# Patient Record
Sex: Male | Born: 1950 | Race: White | Hispanic: No | Marital: Married | Smoking: Never smoker
Health system: Southern US, Community
[De-identification: ages and names within clinical notes are randomized; demographics above are authoritative.]

## PROBLEM LIST (undated history)

## (undated) DIAGNOSIS — M51369 Other intervertebral disc degeneration, lumbar region without mention of lumbar back pain or lower extremity pain: Secondary | ICD-10-CM

## (undated) DIAGNOSIS — I6529 Occlusion and stenosis of unspecified carotid artery: Secondary | ICD-10-CM

## (undated) DIAGNOSIS — E039 Hypothyroidism, unspecified: Secondary | ICD-10-CM

## (undated) DIAGNOSIS — I2510 Atherosclerotic heart disease of native coronary artery without angina pectoris: Secondary | ICD-10-CM

## (undated) DIAGNOSIS — E7800 Pure hypercholesterolemia, unspecified: Secondary | ICD-10-CM

## (undated) DIAGNOSIS — M5136 Other intervertebral disc degeneration, lumbar region: Secondary | ICD-10-CM

## (undated) DIAGNOSIS — I739 Peripheral vascular disease, unspecified: Secondary | ICD-10-CM

## (undated) HISTORY — PX: UVULECTOMY: SHX2631

## (undated) HISTORY — PX: NASAL SEPTUM SURGERY: SHX37

## (undated) HISTORY — DX: Other intervertebral disc degeneration, lumbar region: M51.36

## (undated) HISTORY — DX: Other intervertebral disc degeneration, lumbar region without mention of lumbar back pain or lower extremity pain: M51.369

## (undated) HISTORY — PX: HEMORROIDECTOMY: SUR656

## (undated) HISTORY — DX: Pure hypercholesterolemia, unspecified: E78.00

## (undated) HISTORY — PX: PROSTATECTOMY: SHX69

## (undated) HISTORY — DX: Peripheral vascular disease, unspecified: I73.9

## (undated) HISTORY — DX: Hypothyroidism, unspecified: E03.9

## (undated) HISTORY — DX: Occlusion and stenosis of unspecified carotid artery: I65.29

## (undated) HISTORY — DX: Atherosclerotic heart disease of native coronary artery without angina pectoris: I25.10

---
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
## 1986-03-22 HISTORY — PX: HERNIA REPAIR: SHX51

## 1999-03-23 HISTORY — PX: CORONARY STENT PLACEMENT: SHX1402

## 2014-07-18 LAB — PULMONARY FUNCTION TEST
FEV1/FVC: 79.5 %
FEV1: 3.1 L
FVC: 4.1 L

## 2014-09-04 DIAGNOSIS — R972 Elevated prostate specific antigen [PSA]: Secondary | ICD-10-CM

## 2014-09-04 HISTORY — DX: Elevated prostate specific antigen (PSA): R97.20

## 2014-10-03 DIAGNOSIS — N138 Other obstructive and reflux uropathy: Secondary | ICD-10-CM | POA: Insufficient documentation

## 2014-10-03 DIAGNOSIS — N401 Enlarged prostate with lower urinary tract symptoms: Secondary | ICD-10-CM

## 2014-10-03 HISTORY — DX: Benign prostatic hyperplasia with lower urinary tract symptoms: N40.1

## 2014-10-03 HISTORY — DX: Benign prostatic hyperplasia with lower urinary tract symptoms: N13.8

## 2014-10-24 DIAGNOSIS — C61 Malignant neoplasm of prostate: Secondary | ICD-10-CM | POA: Insufficient documentation

## 2014-10-24 HISTORY — DX: Malignant neoplasm of prostate: C61

## 2015-03-14 DIAGNOSIS — N529 Male erectile dysfunction, unspecified: Secondary | ICD-10-CM | POA: Insufficient documentation

## 2015-03-14 HISTORY — DX: Male erectile dysfunction, unspecified: N52.9

## 2016-05-19 DIAGNOSIS — Z860100 Personal history of colon polyps, unspecified: Secondary | ICD-10-CM

## 2016-05-19 DIAGNOSIS — K5190 Ulcerative colitis, unspecified, without complications: Secondary | ICD-10-CM

## 2016-05-19 DIAGNOSIS — R194 Change in bowel habit: Secondary | ICD-10-CM | POA: Insufficient documentation

## 2016-05-19 HISTORY — DX: Personal history of colon polyps, unspecified: Z86.0100

## 2016-05-19 HISTORY — DX: Personal history of colonic polyps: Z86.010

## 2016-05-19 HISTORY — DX: Ulcerative colitis, unspecified, without complications: K51.90

## 2016-05-19 HISTORY — DX: Change in bowel habit: R19.4

## 2017-01-24 DIAGNOSIS — J343 Hypertrophy of nasal turbinates: Secondary | ICD-10-CM

## 2017-01-24 DIAGNOSIS — H608X1 Other otitis externa, right ear: Secondary | ICD-10-CM

## 2017-01-24 HISTORY — DX: Other otitis externa, right ear: H60.8X1

## 2017-01-24 HISTORY — DX: Hypertrophy of nasal turbinates: J34.3

## 2018-12-15 DIAGNOSIS — R351 Nocturia: Secondary | ICD-10-CM

## 2018-12-15 HISTORY — DX: Nocturia: R35.1

## 2019-04-21 DIAGNOSIS — U071 COVID-19: Secondary | ICD-10-CM | POA: Diagnosis not present

## 2019-04-21 DIAGNOSIS — R05 Cough: Secondary | ICD-10-CM | POA: Diagnosis not present

## 2019-07-19 DIAGNOSIS — R399 Unspecified symptoms and signs involving the genitourinary system: Secondary | ICD-10-CM | POA: Diagnosis not present

## 2019-07-19 DIAGNOSIS — C61 Malignant neoplasm of prostate: Secondary | ICD-10-CM | POA: Diagnosis not present

## 2019-07-19 DIAGNOSIS — R3989 Other symptoms and signs involving the genitourinary system: Secondary | ICD-10-CM | POA: Diagnosis not present

## 2019-07-31 DIAGNOSIS — R739 Hyperglycemia, unspecified: Secondary | ICD-10-CM | POA: Diagnosis not present

## 2019-07-31 DIAGNOSIS — M545 Low back pain: Secondary | ICD-10-CM | POA: Diagnosis not present

## 2019-07-31 DIAGNOSIS — Z20822 Contact with and (suspected) exposure to covid-19: Secondary | ICD-10-CM | POA: Diagnosis not present

## 2019-07-31 DIAGNOSIS — M62830 Muscle spasm of back: Secondary | ICD-10-CM | POA: Diagnosis not present

## 2019-07-31 DIAGNOSIS — M129 Arthropathy, unspecified: Secondary | ICD-10-CM | POA: Diagnosis not present

## 2019-07-31 DIAGNOSIS — Z0000 Encounter for general adult medical examination without abnormal findings: Secondary | ICD-10-CM | POA: Diagnosis not present

## 2019-08-03 DIAGNOSIS — M9904 Segmental and somatic dysfunction of sacral region: Secondary | ICD-10-CM | POA: Diagnosis not present

## 2019-08-03 DIAGNOSIS — M9903 Segmental and somatic dysfunction of lumbar region: Secondary | ICD-10-CM | POA: Diagnosis not present

## 2019-08-03 DIAGNOSIS — M9902 Segmental and somatic dysfunction of thoracic region: Secondary | ICD-10-CM | POA: Diagnosis not present

## 2019-08-03 DIAGNOSIS — M9905 Segmental and somatic dysfunction of pelvic region: Secondary | ICD-10-CM | POA: Diagnosis not present

## 2019-08-06 DIAGNOSIS — M25559 Pain in unspecified hip: Secondary | ICD-10-CM | POA: Diagnosis not present

## 2019-08-06 DIAGNOSIS — M545 Low back pain, unspecified: Secondary | ICD-10-CM

## 2019-08-06 DIAGNOSIS — M25551 Pain in right hip: Secondary | ICD-10-CM | POA: Diagnosis not present

## 2019-08-06 HISTORY — DX: Low back pain, unspecified: M54.50

## 2019-08-13 DIAGNOSIS — M5136 Other intervertebral disc degeneration, lumbar region: Secondary | ICD-10-CM | POA: Diagnosis not present

## 2019-08-13 DIAGNOSIS — M545 Low back pain: Secondary | ICD-10-CM | POA: Diagnosis not present

## 2019-08-13 DIAGNOSIS — M4316 Spondylolisthesis, lumbar region: Secondary | ICD-10-CM | POA: Diagnosis not present

## 2019-08-13 DIAGNOSIS — M5126 Other intervertebral disc displacement, lumbar region: Secondary | ICD-10-CM | POA: Diagnosis not present

## 2019-08-13 DIAGNOSIS — M47816 Spondylosis without myelopathy or radiculopathy, lumbar region: Secondary | ICD-10-CM | POA: Diagnosis not present

## 2019-08-15 DIAGNOSIS — M545 Low back pain: Secondary | ICD-10-CM | POA: Diagnosis not present

## 2020-01-15 DIAGNOSIS — I2510 Atherosclerotic heart disease of native coronary artery without angina pectoris: Secondary | ICD-10-CM | POA: Diagnosis not present

## 2020-01-15 DIAGNOSIS — E039 Hypothyroidism, unspecified: Secondary | ICD-10-CM | POA: Diagnosis not present

## 2020-01-15 DIAGNOSIS — R739 Hyperglycemia, unspecified: Secondary | ICD-10-CM | POA: Diagnosis not present

## 2020-01-15 DIAGNOSIS — Z79899 Other long term (current) drug therapy: Secondary | ICD-10-CM | POA: Diagnosis not present

## 2020-01-15 DIAGNOSIS — E782 Mixed hyperlipidemia: Secondary | ICD-10-CM | POA: Diagnosis not present

## 2020-01-15 DIAGNOSIS — Z23 Encounter for immunization: Secondary | ICD-10-CM | POA: Diagnosis not present

## 2020-01-31 DIAGNOSIS — E039 Hypothyroidism, unspecified: Secondary | ICD-10-CM | POA: Diagnosis not present

## 2020-01-31 DIAGNOSIS — I2510 Atherosclerotic heart disease of native coronary artery without angina pectoris: Secondary | ICD-10-CM | POA: Diagnosis not present

## 2020-01-31 DIAGNOSIS — E782 Mixed hyperlipidemia: Secondary | ICD-10-CM | POA: Diagnosis not present

## 2020-01-31 DIAGNOSIS — I6529 Occlusion and stenosis of unspecified carotid artery: Secondary | ICD-10-CM | POA: Diagnosis not present

## 2020-01-31 DIAGNOSIS — M79606 Pain in leg, unspecified: Secondary | ICD-10-CM | POA: Diagnosis not present

## 2020-03-02 DIAGNOSIS — Z23 Encounter for immunization: Secondary | ICD-10-CM | POA: Diagnosis not present

## 2020-03-26 DIAGNOSIS — M4316 Spondylolisthesis, lumbar region: Secondary | ICD-10-CM | POA: Diagnosis not present

## 2020-03-26 DIAGNOSIS — M5126 Other intervertebral disc displacement, lumbar region: Secondary | ICD-10-CM | POA: Diagnosis not present

## 2020-03-28 DIAGNOSIS — R6889 Other general symptoms and signs: Secondary | ICD-10-CM | POA: Insufficient documentation

## 2020-03-28 HISTORY — DX: Other general symptoms and signs: R68.89

## 2020-04-10 DIAGNOSIS — M545 Low back pain, unspecified: Secondary | ICD-10-CM | POA: Diagnosis not present

## 2020-04-10 DIAGNOSIS — M5126 Other intervertebral disc displacement, lumbar region: Secondary | ICD-10-CM | POA: Diagnosis not present

## 2020-04-10 DIAGNOSIS — R6889 Other general symptoms and signs: Secondary | ICD-10-CM | POA: Diagnosis not present

## 2020-04-22 DIAGNOSIS — R6889 Other general symptoms and signs: Secondary | ICD-10-CM | POA: Diagnosis not present

## 2020-04-22 DIAGNOSIS — M545 Low back pain, unspecified: Secondary | ICD-10-CM | POA: Diagnosis not present

## 2020-04-30 DIAGNOSIS — E039 Hypothyroidism, unspecified: Secondary | ICD-10-CM | POA: Diagnosis not present

## 2020-04-30 DIAGNOSIS — Z79899 Other long term (current) drug therapy: Secondary | ICD-10-CM | POA: Diagnosis not present

## 2020-04-30 DIAGNOSIS — E782 Mixed hyperlipidemia: Secondary | ICD-10-CM | POA: Diagnosis not present

## 2020-04-30 DIAGNOSIS — I2510 Atherosclerotic heart disease of native coronary artery without angina pectoris: Secondary | ICD-10-CM | POA: Diagnosis not present

## 2020-04-30 DIAGNOSIS — Z0000 Encounter for general adult medical examination without abnormal findings: Secondary | ICD-10-CM | POA: Diagnosis not present

## 2020-04-30 DIAGNOSIS — R739 Hyperglycemia, unspecified: Secondary | ICD-10-CM | POA: Diagnosis not present

## 2020-05-05 DIAGNOSIS — R6889 Other general symptoms and signs: Secondary | ICD-10-CM | POA: Diagnosis not present

## 2020-05-05 DIAGNOSIS — M545 Low back pain, unspecified: Secondary | ICD-10-CM | POA: Diagnosis not present

## 2020-05-14 DIAGNOSIS — R6889 Other general symptoms and signs: Secondary | ICD-10-CM | POA: Diagnosis not present

## 2020-05-14 DIAGNOSIS — M545 Low back pain, unspecified: Secondary | ICD-10-CM | POA: Diagnosis not present

## 2020-05-27 DIAGNOSIS — I272 Pulmonary hypertension, unspecified: Secondary | ICD-10-CM | POA: Diagnosis not present

## 2020-05-27 DIAGNOSIS — E039 Hypothyroidism, unspecified: Secondary | ICD-10-CM | POA: Diagnosis not present

## 2020-05-27 DIAGNOSIS — I739 Peripheral vascular disease, unspecified: Secondary | ICD-10-CM | POA: Diagnosis not present

## 2020-05-27 DIAGNOSIS — I2510 Atherosclerotic heart disease of native coronary artery without angina pectoris: Secondary | ICD-10-CM | POA: Diagnosis not present

## 2020-05-27 DIAGNOSIS — E782 Mixed hyperlipidemia: Secondary | ICD-10-CM | POA: Diagnosis not present

## 2020-06-05 DIAGNOSIS — M4316 Spondylolisthesis, lumbar region: Secondary | ICD-10-CM | POA: Diagnosis not present

## 2020-06-05 DIAGNOSIS — M5126 Other intervertebral disc displacement, lumbar region: Secondary | ICD-10-CM | POA: Diagnosis not present

## 2020-06-11 DIAGNOSIS — M4316 Spondylolisthesis, lumbar region: Secondary | ICD-10-CM | POA: Diagnosis not present

## 2020-06-11 DIAGNOSIS — M5126 Other intervertebral disc displacement, lumbar region: Secondary | ICD-10-CM | POA: Diagnosis not present

## 2020-06-26 DIAGNOSIS — M4310 Spondylolisthesis, site unspecified: Secondary | ICD-10-CM | POA: Diagnosis not present

## 2020-06-26 DIAGNOSIS — G959 Disease of spinal cord, unspecified: Secondary | ICD-10-CM

## 2020-06-26 DIAGNOSIS — M5416 Radiculopathy, lumbar region: Secondary | ICD-10-CM

## 2020-06-26 HISTORY — DX: Disease of spinal cord, unspecified: G95.9

## 2020-06-26 HISTORY — DX: Spondylolisthesis, site unspecified: M43.10

## 2020-06-26 HISTORY — DX: Radiculopathy, lumbar region: M54.16

## 2020-06-30 ENCOUNTER — Other Ambulatory Visit: Payer: Self-pay | Admitting: Neurosurgery

## 2020-06-30 DIAGNOSIS — G959 Disease of spinal cord, unspecified: Secondary | ICD-10-CM

## 2020-07-01 DIAGNOSIS — G8929 Other chronic pain: Secondary | ICD-10-CM

## 2020-07-01 DIAGNOSIS — M7552 Bursitis of left shoulder: Secondary | ICD-10-CM | POA: Diagnosis not present

## 2020-07-01 DIAGNOSIS — M7551 Bursitis of right shoulder: Secondary | ICD-10-CM | POA: Diagnosis not present

## 2020-07-01 DIAGNOSIS — M5126 Other intervertebral disc displacement, lumbar region: Secondary | ICD-10-CM | POA: Diagnosis not present

## 2020-07-01 DIAGNOSIS — M542 Cervicalgia: Secondary | ICD-10-CM | POA: Diagnosis not present

## 2020-07-01 DIAGNOSIS — M5416 Radiculopathy, lumbar region: Secondary | ICD-10-CM | POA: Diagnosis not present

## 2020-07-01 HISTORY — DX: Other chronic pain: G89.29

## 2020-07-01 HISTORY — DX: Bursitis of right shoulder: M75.51

## 2020-07-01 HISTORY — DX: Bursitis of left shoulder: M75.52

## 2020-07-05 ENCOUNTER — Ambulatory Visit
Admission: RE | Admit: 2020-07-05 | Discharge: 2020-07-05 | Disposition: A | Discharge status: Home / Self Care | Payer: BLUE CROSS/BLUE SHIELD | Source: Ambulatory Visit | Attending: Neurosurgery | Admitting: Neurosurgery

## 2020-07-05 ENCOUNTER — Other Ambulatory Visit: Payer: Self-pay

## 2020-07-05 DIAGNOSIS — G959 Disease of spinal cord, unspecified: Secondary | ICD-10-CM

## 2020-07-09 DIAGNOSIS — M5416 Radiculopathy, lumbar region: Secondary | ICD-10-CM | POA: Diagnosis not present

## 2020-07-23 DIAGNOSIS — M7551 Bursitis of right shoulder: Secondary | ICD-10-CM | POA: Diagnosis not present

## 2020-07-23 DIAGNOSIS — Z6825 Body mass index (BMI) 25.0-25.9, adult: Secondary | ICD-10-CM | POA: Diagnosis not present

## 2020-07-25 DIAGNOSIS — M25511 Pain in right shoulder: Secondary | ICD-10-CM | POA: Diagnosis not present

## 2020-07-25 DIAGNOSIS — M25512 Pain in left shoulder: Secondary | ICD-10-CM | POA: Diagnosis not present

## 2020-07-31 DIAGNOSIS — M25561 Pain in right knee: Secondary | ICD-10-CM | POA: Diagnosis not present

## 2020-07-31 DIAGNOSIS — M25511 Pain in right shoulder: Secondary | ICD-10-CM | POA: Diagnosis not present

## 2020-07-31 DIAGNOSIS — M25512 Pain in left shoulder: Secondary | ICD-10-CM | POA: Diagnosis not present

## 2020-08-04 DIAGNOSIS — Z125 Encounter for screening for malignant neoplasm of prostate: Secondary | ICD-10-CM | POA: Diagnosis not present

## 2020-08-04 DIAGNOSIS — R7309 Other abnormal glucose: Secondary | ICD-10-CM | POA: Diagnosis not present

## 2020-08-04 DIAGNOSIS — Z6825 Body mass index (BMI) 25.0-25.9, adult: Secondary | ICD-10-CM | POA: Diagnosis not present

## 2020-08-04 DIAGNOSIS — E038 Other specified hypothyroidism: Secondary | ICD-10-CM | POA: Diagnosis not present

## 2020-08-04 DIAGNOSIS — E7800 Pure hypercholesterolemia, unspecified: Secondary | ICD-10-CM | POA: Diagnosis not present

## 2020-08-04 DIAGNOSIS — Z8546 Personal history of malignant neoplasm of prostate: Secondary | ICD-10-CM | POA: Diagnosis not present

## 2020-08-05 DIAGNOSIS — M25512 Pain in left shoulder: Secondary | ICD-10-CM | POA: Diagnosis not present

## 2020-08-05 DIAGNOSIS — M25511 Pain in right shoulder: Secondary | ICD-10-CM | POA: Diagnosis not present

## 2020-09-04 DIAGNOSIS — M5416 Radiculopathy, lumbar region: Secondary | ICD-10-CM | POA: Diagnosis not present

## 2020-09-09 DIAGNOSIS — M25511 Pain in right shoulder: Secondary | ICD-10-CM | POA: Diagnosis not present

## 2020-11-06 DIAGNOSIS — E7800 Pure hypercholesterolemia, unspecified: Secondary | ICD-10-CM | POA: Diagnosis not present

## 2020-11-06 DIAGNOSIS — I2510 Atherosclerotic heart disease of native coronary artery without angina pectoris: Secondary | ICD-10-CM | POA: Diagnosis not present

## 2020-11-06 DIAGNOSIS — E039 Hypothyroidism, unspecified: Secondary | ICD-10-CM | POA: Diagnosis not present

## 2020-11-06 DIAGNOSIS — Z6825 Body mass index (BMI) 25.0-25.9, adult: Secondary | ICD-10-CM | POA: Diagnosis not present

## 2020-12-18 DIAGNOSIS — R198 Other specified symptoms and signs involving the digestive system and abdomen: Secondary | ICD-10-CM | POA: Diagnosis not present

## 2020-12-18 DIAGNOSIS — Z1211 Encounter for screening for malignant neoplasm of colon: Secondary | ICD-10-CM | POA: Diagnosis not present

## 2021-01-11 DIAGNOSIS — Z23 Encounter for immunization: Secondary | ICD-10-CM | POA: Diagnosis not present

## 2021-02-03 DIAGNOSIS — M79606 Pain in leg, unspecified: Secondary | ICD-10-CM | POA: Diagnosis not present

## 2021-02-03 DIAGNOSIS — I6529 Occlusion and stenosis of unspecified carotid artery: Secondary | ICD-10-CM | POA: Diagnosis not present

## 2021-02-09 DIAGNOSIS — I2510 Atherosclerotic heart disease of native coronary artery without angina pectoris: Secondary | ICD-10-CM | POA: Diagnosis not present

## 2021-02-09 DIAGNOSIS — E7800 Pure hypercholesterolemia, unspecified: Secondary | ICD-10-CM | POA: Diagnosis not present

## 2021-02-09 DIAGNOSIS — R5383 Other fatigue: Secondary | ICD-10-CM | POA: Diagnosis not present

## 2021-02-09 DIAGNOSIS — E039 Hypothyroidism, unspecified: Secondary | ICD-10-CM | POA: Diagnosis not present

## 2021-02-09 DIAGNOSIS — I499 Cardiac arrhythmia, unspecified: Secondary | ICD-10-CM | POA: Diagnosis not present

## 2021-02-19 ENCOUNTER — Encounter: Payer: Self-pay | Admitting: *Deleted

## 2021-02-19 ENCOUNTER — Encounter: Payer: Self-pay | Admitting: Cardiology

## 2021-02-19 ENCOUNTER — Other Ambulatory Visit: Payer: Self-pay

## 2021-02-19 DIAGNOSIS — E039 Hypothyroidism, unspecified: Secondary | ICD-10-CM | POA: Insufficient documentation

## 2021-02-19 DIAGNOSIS — M5136 Other intervertebral disc degeneration, lumbar region: Secondary | ICD-10-CM | POA: Insufficient documentation

## 2021-02-19 DIAGNOSIS — I6529 Occlusion and stenosis of unspecified carotid artery: Secondary | ICD-10-CM | POA: Insufficient documentation

## 2021-02-19 DIAGNOSIS — E7800 Pure hypercholesterolemia, unspecified: Secondary | ICD-10-CM | POA: Insufficient documentation

## 2021-02-19 DIAGNOSIS — I2510 Atherosclerotic heart disease of native coronary artery without angina pectoris: Secondary | ICD-10-CM | POA: Insufficient documentation

## 2021-02-19 DIAGNOSIS — I739 Peripheral vascular disease, unspecified: Secondary | ICD-10-CM | POA: Insufficient documentation

## 2021-02-19 NOTE — Progress Notes (Signed)
Cardiology Office Note:    Date:  02/20/2021   ID:  Dylan Mcdowell, DOB Aug 22, 1950, MRN [id number]  PCP:  Esperanza Richters, NP  Cardiologist:  Shirlee More, MD   Referring MD: Esperanza Richters, NP  ASSESSMENT:    1. Coronary artery disease involving native coronary artery of native heart without angina pectoris   2. Stenosis of carotid artery, unspecified laterality   3. Hypercholesterolemia    PLAN:    In order of problems listed above:  He has a long history of CAD multiple PCI's 2001 for in-stent restenosis and since then has done well without recurrent angina.  At this time I do not see any indication for ischemia evaluation continue his aspirin statin and trend blood pressures at home. I requested records from his carotids and lower extremity vascular disease Continue statin access labs with LDL  Next appointment 1 year   Medication Adjustments/Labs and Tests Ordered: Current medicines are reviewed at length with the patient today.  Concerns regarding medicines are outlined above.  Orders Placed This Encounter  Procedures   EKG 12-Lead   Meds ordered this encounter  Medications   DISCONTD: aspirin EC 81 MG tablet    Sig: Take 1 tablet (81 mg total) by mouth daily. Swallow whole.    Dispense:  90 tablet    Refill:  3   DISCONTD: metoprolol succinate (TOPROL XL) 25 MG 24 hr tablet    Sig: Take 1 tablet (25 mg total) by mouth daily.    Dispense:  90 tablet    Refill:  3   aspirin EC 81 MG tablet    Sig: Take 1 tablet (81 mg total) by mouth daily. Swallow whole.    Dispense:  90 tablet    Refill:  3     Chief Complaint  Patient presents with   Coronary Artery Disease    And a PCI 2001    History of Present Illness:    Dylan Mcdowell is a 70 y.o. male with hypertension and carotid disease who is being seen today for the evaluation of CAD with a history of PCI and stent decades ago in 2021 at the request of Wilburn, Nita Sells, NP. Chart review shows no  cardiology records or testing available in epic or Care Everywhere. There is a notation that he wants admitted to [facility] June 2021 Dr. Clovia Cuff who is a cardiologist.  Cardiac history begins with PCI and atherectomy Dr. Atilano Median [facility] with subsequent restenosis with a another cath and stent and subsequent recurrent stenosis and then intervention [facility] he is unsure what was particularly was done and has never had a problem again. He has embraced a healthy lifestyle diet and exercise 6 hours a week and has had no recurrent cardiovascular symptoms in 20 years no chest pain edema shortness of breath palpitation or syncope. He has had screening duplex of his carotids and lower extremities what he tells me he has mild atherosclerosis no stenosis.  He had multiple stress test the last about 3 years ago  He has no angina shortness of breath palpitation or syncope.  He complains of some muscle weakness low testosterone but is not on replacement therapy. He has no history of hypertension. He is on a statin but surprisingly does not take aspirin Past Medical History:  Diagnosis Date   Altered bowel habits 05/19/2016   Atherosclerotic heart disease of native coronary artery without angina pectoris    Benign prostatic hyperplasia  with urinary obstruction 10/03/2014   Carotid artery stenosis    Chronic eczematous otitis externa of right ear 01/24/2017   Formatting of this note might be different from the original. Last Assessment & Plan:  Formatting of this note might be different from the original. Concern over itching and discomfort in the ears. Chronic history of the last 6 months or so of intermittent symptoms as above.  Worse on the right side.  No recent treatment. EXAMINATION reveals eczematous changes the external canal worse on the right   Coronary artery disease    History of stent placement   DDD (degenerative disc disease), lumbar    Decreased  functional activity tolerance 03/28/2020   ED (erectile dysfunction) of organic origin 03/14/2015   Elevated prostate specific antigen (PSA) 09/04/2014   History of colon polyps 05/19/2016   Hypercholesterolemia    Hypertrophy of inferior nasal turbinate 01/24/2017   Formatting of this note might be different from the original. Last Assessment & Plan:  Formatting of this note might be different from the original. Concern over nasal obstruction at night. Chronic history.  No infectious sounding symptoms.  Year-round.  Really has not tried any medications.  He had septal surgery in the past. EXAM shows nasal septum slightly more to the left than the right but no   Hypothyroidism    Low back pain 08/06/2019   Formatting of this note might be different from the original. Description:   Nocturia 12/15/2018   Peripheral vascular disease ([place])    Prostate cancer ([place]) 10/24/2014   Ulcerative colitis, unspecified, without complications ([place]) 85/88/5027    Past Surgical History:  Procedure Laterality Date   [place]   PROSTATECTOMY      Current Medications: Current Meds  Medication Sig   Ascorbic Acid 500 MG CHEW Chew 500 mg by mouth daily.   aspirin EC 81 MG tablet Take 1 tablet (81 mg total) by mouth daily. Swallow whole.   Cholecalciferol 25 MCG (1000 UT) capsule Take 1,000 Units by mouth daily.   folic acid (FOLVITE) 741 MCG tablet Take 800 mcg by mouth daily.   levothyroxine (SYNTHROID) 75 MCG tablet Take 75 mcg by mouth daily.   melatonin 5 MG TABS Take 5 mg by mouth at bedtime as needed for sleep.   MOBIC 7.5 MG tablet Take 7.5 mg by mouth 2 (two) times daily as needed for pain. for pain   Multiple Vitamin (MULTI-VITAMIN) tablet Take 2 tablets by mouth daily.   niacin 500 MG tablet Take 500 mg by mouth daily.   Omega-3 Fatty Acids (FISH OIL) 1000 MG CAPS Take 1 capsule by mouth daily.    simvastatin (ZOCOR) 40 MG tablet Take 40 mg by mouth daily.   tamsulosin (FLOMAX) 0.4 MG CAPS capsule Take 0.4 mg by mouth daily.   [DISCONTINUED] aspirin EC 81 MG tablet Take 1 tablet (81 mg total) by mouth daily. Swallow whole.   [DISCONTINUED] metoprolol succinate (TOPROL XL) 25 MG 24 hr tablet Take 1 tablet (25 mg total) by mouth daily.     Allergies:   Patient has no known allergies.   Social History   Socioeconomic History   Marital status: Married    Spouse name: Not on file   Number of children: Not on file   Years of education: Not on file   Highest education level: Not on file  Occupational History  Not on file  Tobacco Use   Smoking status: Never    Passive exposure: Past   Smokeless tobacco: Never  Vaping Use   Vaping Use: Never used  Substance and Sexual Activity   Alcohol use: Yes    Comment: once a month   Drug use: Never   Sexual activity: Not on file  Other Topics Concern   Not on file  Social History Narrative   Not on file   Social Determinants of Health   Financial Resource Strain: Not on file  Food Insecurity: Not on file  Transportation Needs: Not on file  Physical Activity: Not on file  Stress: Not on file  Social Connections: Not on file     Family History: The patient's family history includes Alzheimer's disease in his mother; Heart attack in his brother, brother, and father.  ROS:   ROS Please see the history of present illness.     All other systems reviewed and are negative.  EKGs/Labs/Other Studies Reviewed:    The following studies were reviewed today:   EKG:  EKG is sinus rhythm normal EKG ordered today.   Recent Labs: Labs and vascular studies requested  Physical Exam:    VS:  BP (!) 146/86   Pulse 63   Ht 6' (1.829 m)   Wt 182 lb 9.6 oz (82.8 kg)   SpO2 99%   BMI 24.77 kg/m     Wt Readings from Last 3 Encounters:  02/20/21 182 lb 9.6 oz (82.8 kg)  02/09/21 179 lb (81.2 kg)     GEN:  Well nourished, well  developed in no acute distress HEENT: Normal NECK: No JVD; No carotid bruits LYMPHATICS: No lymphadenopathy CARDIAC: RRR, no murmurs, rubs, gallops RESPIRATORY:  Clear to auscultation without rales, wheezing or rhonchi  ABDOMEN: Soft, non-tender, non-distended MUSCULOSKELETAL:  No edema; No deformity  SKIN: Warm and dry NEUROLOGIC:  Alert and oriented x 3 PSYCHIATRIC:  Normal affect     Signed, Shirlee More, MD  02/20/2021 3:24 PM    [facility]

## 2021-02-20 ENCOUNTER — Encounter: Payer: Self-pay | Admitting: Cardiology

## 2021-02-20 ENCOUNTER — Ambulatory Visit (INDEPENDENT_AMBULATORY_CARE_PROVIDER_SITE_OTHER): Payer: Medicare Other | Admitting: Cardiology

## 2021-02-20 ENCOUNTER — Other Ambulatory Visit: Payer: Self-pay

## 2021-02-20 VITALS — BP 146/86 | HR 63 | Ht 72.0 in | Wt 182.6 lb

## 2021-02-20 DIAGNOSIS — E7800 Pure hypercholesterolemia, unspecified: Secondary | ICD-10-CM

## 2021-02-20 DIAGNOSIS — I6529 Occlusion and stenosis of unspecified carotid artery: Secondary | ICD-10-CM | POA: Diagnosis not present

## 2021-02-20 DIAGNOSIS — I2510 Atherosclerotic heart disease of native coronary artery without angina pectoris: Secondary | ICD-10-CM

## 2021-02-20 MED ORDER — ASPIRIN EC 81 MG PO TBEC: 81.0000 mg | DELAYED_RELEASE_TABLET | Freq: Every day | ORAL | 3 refills | Status: DC

## 2021-02-20 MED ORDER — METOPROLOL SUCCINATE ER 25 MG PO TB24: 25.0000 mg | ORAL_TABLET | Freq: Every day | ORAL | 3 refills | Status: DC

## 2021-02-20 NOTE — Patient Instructions (Signed)
Medication Instructions:  Your physician has recommended you make the following change in your medication:  START: Aspirin 81 mg take one tablet by mouth daily.  *If you need a refill on your cardiac medications before your next appointment, please call your pharmacy*   Lab Work: None If you have labs (blood work) drawn today and your tests are completely normal, you will receive your results only by: [place] (if you have MyChart) OR A paper copy in the mail If you have any lab test that is abnormal or we need to change your treatment, we will call you to review the results.   Testing/Procedures: None   Follow-Up: At [facility], you and your health needs are our priority.  As part of our continuing mission to provide you with exceptional heart care, we have created designated Provider Care Teams.  These Care Teams include your primary Cardiologist (physician) and Advanced Practice Providers (APPs -  Physician Assistants and Nurse Practitioners) who all work together to provide you with the care you need, when you need it.  We recommend signing up for the patient portal called "MyChart".  Sign up information is provided on this After Visit Summary.  MyChart is used to connect with patients for Virtual Visits (Telemedicine).  Patients are able to view lab/test results, encounter notes, upcoming appointments, etc.  Non-urgent messages can be sent to your provider as well.   To learn more about what you can do with MyChart, go to [email or web address].    Your next appointment:   1 year(s)  The format for your next appointment:   In Person  Provider:   Shirlee More, MD    Other Instructions

## 2021-05-15 DIAGNOSIS — E039 Hypothyroidism, unspecified: Secondary | ICD-10-CM | POA: Diagnosis not present

## 2021-05-15 DIAGNOSIS — Z0000 Encounter for general adult medical examination without abnormal findings: Secondary | ICD-10-CM | POA: Diagnosis not present

## 2021-05-15 DIAGNOSIS — I2510 Atherosclerotic heart disease of native coronary artery without angina pectoris: Secondary | ICD-10-CM | POA: Diagnosis not present

## 2021-05-15 DIAGNOSIS — Z23 Encounter for immunization: Secondary | ICD-10-CM | POA: Diagnosis not present

## 2021-05-15 DIAGNOSIS — E7800 Pure hypercholesterolemia, unspecified: Secondary | ICD-10-CM | POA: Diagnosis not present

## 2021-09-14 DIAGNOSIS — Z6824 Body mass index (BMI) 24.0-24.9, adult: Secondary | ICD-10-CM | POA: Diagnosis not present

## 2021-09-14 DIAGNOSIS — I2510 Atherosclerotic heart disease of native coronary artery without angina pectoris: Secondary | ICD-10-CM | POA: Diagnosis not present

## 2021-09-14 DIAGNOSIS — R5383 Other fatigue: Secondary | ICD-10-CM | POA: Diagnosis not present

## 2021-09-14 DIAGNOSIS — E039 Hypothyroidism, unspecified: Secondary | ICD-10-CM | POA: Diagnosis not present

## 2021-09-14 DIAGNOSIS — E7800 Pure hypercholesterolemia, unspecified: Secondary | ICD-10-CM | POA: Diagnosis not present

## 2021-09-16 DIAGNOSIS — E7800 Pure hypercholesterolemia, unspecified: Secondary | ICD-10-CM | POA: Diagnosis not present

## 2021-09-16 DIAGNOSIS — I2510 Atherosclerotic heart disease of native coronary artery without angina pectoris: Secondary | ICD-10-CM | POA: Diagnosis not present

## 2021-09-16 DIAGNOSIS — E039 Hypothyroidism, unspecified: Secondary | ICD-10-CM | POA: Diagnosis not present

## 2021-09-16 DIAGNOSIS — R5383 Other fatigue: Secondary | ICD-10-CM | POA: Diagnosis not present

## 2021-09-17 ENCOUNTER — Encounter: Payer: Self-pay | Admitting: *Deleted

## 2021-09-17 ENCOUNTER — Encounter: Payer: Self-pay | Admitting: Cardiology

## 2021-10-19 DIAGNOSIS — I2510 Atherosclerotic heart disease of native coronary artery without angina pectoris: Secondary | ICD-10-CM | POA: Insufficient documentation

## 2021-10-19 NOTE — Progress Notes (Unsigned)
Cardiology Office Note:    Date:  10/20/2021   ID:  Cy Blamer, DOB [birth year], MRN [id number]  PCP:  Esperanza Richters, NP  Cardiologist:  Shirlee More, MD    Referring MD: Greig Right, MD    ASSESSMENT:    1. Coronary artery disease involving native coronary artery of native heart without angina pectoris   2. Hypercholesterolemia    PLAN:    In order of problems listed above:  Overall he is doing well he has no anginal discomfort but he has had a change in exercise ability we discussed mechanisms for evaluation of his CAD I think he is well suited for stress perfusion study he will continue aspirin lipid-lowering with statin and Zetia and we will do a myocardial perfusion study in our office if he has high risk markers consider repeat coronary angiography he certainly is at risk.  He is comfortable with this approach. Continue combined lipid-lowering treatment request recent labs   Next appointment: 6 month   Medication Adjustments/Labs and Tests Ordered: Current medicines are reviewed at length with the patient today.  Concerns regarding medicines are outlined above.  No orders of the defined types were placed in this encounter.  No orders of the defined types were placed in this encounter.   I had a change in fatigue when I do vigorous physical effort   History of Present Illness:    Lycan Davee is a 71 y.o. male with a hx of CAD with a history of remote PCI in 2001  and stent hypertension hyperlipidemia and carotid disease last seen 02/20/2021. Cardiac history begins with PCI and atherectomy Dr. Atilano Median [facility] with subsequent restenosis with a another cath and stent and subsequent recurrent stenosis and then intervention [facility] he is unsure what was particularly was done and has never had a problem again. He has embraced a healthy lifestyle diet and exercise 6 hours a week and has had no recurrent cardiovascular symptoms in 20  years no chest pain edema shortness of breath palpitation or syncope. He has had screening duplex of his carotids and lower extremities what he tells me he has mild atherosclerosis no stenosis.  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  Once in a while he needs to stop and rest he has had no chest pain or shortness of breath.  Said no changes in his exercise routine.  He has just had a subtle alteration and is concerned.  He is not having muscle weakness or pain with his statin  No edema shortness of breath palpitation or syncope Past Medical History:  Diagnosis Date   Altered bowel habits 05/19/2016   Atherosclerotic heart disease of native coronary artery without angina pectoris    Benign prostatic hyperplasia with urinary obstruction 10/03/2014   Bursitis of left shoulder 07/01/2020   Carotid artery stenosis    Cervical myelopathy (H[facility]) 06/26/2020   Chronic eczematous otitis externa of right ear 01/24/2017   Formatting of this note might be different from the original. Last Assessment & Plan:  Formatting of this note might be different from the original. Concern over itching and discomfort in the ears. Chronic history of the last 6 months or so  of intermittent symptoms as above.  Worse on the right side.  No recent treatment. 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 Last Assessment & Plan:  Formatting of this note might be different from the original. Concern over nasal obstruction at night. Chronic history.  No infectious sounding symptoms.  Year-round.  Really has not tried any medications.  He had septal surgery in the past. EXAM shows nasal septum slightly more to the left than the right but no   Hypothyroidism    Low back pain 08/06/2019   Formatting of this note might be different from the original. Description:   Lumbar radiculopathy 06/26/2020   Nocturia 12/15/2018   Other chronic pain 07/01/2020   Peripheral vascular disease ([place])    Prostate cancer ([place]) 10/24/2014   Subacromial bursitis of both shoulders 07/01/2020   Ulcerative colitis, unspecified, without complications ([place]) 76/28/3151    Past Surgical History:  Procedure Laterality Date   [place]   NASAL SEPTUM SURGERY     PROSTATECTOMY     UVULECTOMY     due to snoring    Current Medications: Current Meds  Medication Sig   Ascorbic Acid 500 MG CHEW Chew 500 mg by mouth daily.   aspirin EC 81 MG tablet Take 1 tablet (81 mg total) by mouth daily. Swallow whole.   Cholecalciferol 25 MCG (1000 UT) capsule Take 1,000 Units by mouth daily.   ezetimibe (ZETIA) 10 MG tablet Take 10 mg by mouth daily.   folic acid (FOLVITE) 761 MCG tablet Take 800 mcg by mouth daily.   levothyroxine (SYNTHROID) 75 MCG tablet Take 75 mcg by mouth daily.   melatonin 5 MG TABS Take 5 mg by mouth at bedtime as needed for sleep.   Multiple Vitamin (MULTI-VITAMIN) tablet Take 2 tablets by mouth daily.   niacin 500 MG tablet Take 500 mg by mouth daily.   Omega-3 Fatty Acids (FISH OIL) 1000 MG CAPS Take 1 capsule by mouth daily.   simvastatin (ZOCOR) 40 MG tablet Take 40 mg by  mouth daily.   tamsulosin (FLOMAX) 0.4 MG CAPS capsule Take 0.4 mg by mouth daily.     Allergies:   Patient has no known allergies.   Social History   Socioeconomic History   Marital status: Married    Spouse name: Not on file   Number of children: Not on file   Years of education: Not on file   Highest education level: Not on file  Occupational History   Not on file  Tobacco Use   Smoking status: Never    Passive exposure: Past   Smokeless tobacco: Never  Vaping Use   Vaping Use: Never used  Substance and Sexual Activity   Alcohol use: Yes    Comment: once a month   Drug use: Never   Sexual activity: Not on file  Other Topics Concern   Not on file  Social History Narrative   Not on file   Social Determinants of Health   Financial Resource Strain: Not on file  Food Insecurity: Not on file  Transportation Needs: Not on file  Physical Activity: Not on file  Stress: Not on file  Social Connections: Not on file     Family History: The patient's family history includes Alzheimer's disease in his mother; Heart attack in his brother, brother, and father. ROS:   Please see the history of present illness.    All other systems reviewed and are negative.  EKGs/Labs/Other Studies Reviewed:    The following studies were reviewed today:   Recent Labs: Recent labs 09/14/2021 I requested from his PCP his last lipid profile 02/09/2021 and an LDL of 103 cholesterol 180  Physical Exam:    VS:  BP (!) 102/58   Pulse 74   Ht 6' (1.829 m)   Wt 179 lb 12.8 oz (81.6 kg)   SpO2 98%   BMI 24.39 kg/m     Wt Readings from Last 3 Encounters:  10/20/21 179 lb 12.8 oz (81.6 kg)  09/14/21 180 lb (81.6 kg)  02/20/21 182 lb 9.6 oz (82.8 kg)     GEN:  Well nourished, well developed in no acute distress HEENT: Normal NECK: No JVD; No carotid bruits LYMPHATICS: No lymphadenopathy CARDIAC: RRR, no murmurs, rubs, gallops RESPIRATORY:  Clear to auscultation without rales,  wheezing or rhonchi  ABDOMEN: Soft, non-tender, non-distended MUSCULOSKELETAL:  No edema; No deformity  SKIN: Warm and dry NEUROLOGIC:  Alert and oriented x 3 PSYCHIATRIC:  Normal affect    Signed, Shirlee More, MD  10/20/2021 1:58 PM    [facility]

## 2021-10-20 ENCOUNTER — Encounter: Payer: Self-pay | Admitting: Cardiology

## 2021-10-20 ENCOUNTER — Ambulatory Visit (INDEPENDENT_AMBULATORY_CARE_PROVIDER_SITE_OTHER): Payer: Medicare Other | Admitting: Cardiology

## 2021-10-20 ENCOUNTER — Telehealth: Payer: Self-pay | Admitting: *Deleted

## 2021-10-20 VITALS — BP 102/58 | HR 74 | Ht 72.0 in | Wt 179.8 lb

## 2021-10-20 DIAGNOSIS — I2510 Atherosclerotic heart disease of native coronary artery without angina pectoris: Secondary | ICD-10-CM

## 2021-10-20 DIAGNOSIS — E7800 Pure hypercholesterolemia, unspecified: Secondary | ICD-10-CM | POA: Diagnosis not present

## 2021-10-20 NOTE — Telephone Encounter (Signed)
Patient given detailed instructions per Myocardial Perfusion Study Information Sheet for the test on 10/27/21 at 1130. Patient notified to arrive 15 minutes early and that it is imperative to arrive on time for appointment to keep from having the test rescheduled.  If you need to cancel or reschedule your appointment, please call the office within 24 hours of your appointment. . Patient verbalized understanding.Nathaniel Yaden, Ranae [place]

## 2021-10-20 NOTE — Addendum Note (Signed)
Addended by: Edwyna Shell I on: 10/20/2021 02:14 PM   Modules accepted: Orders

## 2021-10-20 NOTE — Patient Instructions (Signed)
Medication Instructions:  Your physician recommends that you continue on your current medications as directed. Please refer to the Current Medication list given to you today.  *If you need a refill on your cardiac medications before your next appointment, please call your pharmacy*   Lab Work: None If you have labs (blood work) drawn today and your tests are completely normal, you will receive your results only by: [place] (if you have MyChart) OR A paper copy in the mail If you have any lab test that is abnormal or we need to change your treatment, we will call you to review the results.   Testing/Procedures:   [facility] [address] [place], [place] [address] Phone:  [phone]    Please arrive 15 minutes prior to your appointment time for registration and insurance purposes.  The test will take approximately 3 to 4 hours to complete; you may bring reading material.  If someone comes with you to your appointment, they will need to remain in the main lobby due to limited space in the testing area. **If you are pregnant or breastfeeding, please notify the nuclear lab prior to your appointment**  How to prepare for your Myocardial Perfusion Test: Do not eat or drink 3 hours prior to your test, except you may have water. Do not consume products containing caffeine (regular or decaffeinated) 12 hours prior to your test. (ex: coffee, chocolate, sodas, tea). Do bring a list of your current medications with you.  If not listed below, you may take your medications as normal. Do wear comfortable clothes (no dresses or overalls) and walking shoes, tennis shoes preferred (No heels or open toe shoes are allowed). Do NOT wear cologne, perfume, aftershave, or lotions (deodorant is allowed). If these instructions are not followed, your test will have to be rescheduled.  Please report to [address] for your test.  If you have questions or  concerns about your appointment, you can call the [facility] at [phone].  If you cannot keep your appointment, please provide 24 hours notification to the Nuclear Lab, to avoid a possible $50 charge to your account.    Follow-Up: At [facility], you and your health needs are our priority.  As part of our continuing mission to provide you with exceptional heart care, we have created designated Provider Care Teams.  These Care Teams include your primary Cardiologist (physician) and Advanced Practice Providers (APPs -  Physician Assistants and Nurse Practitioners) who all work together to provide you with the care you need, when you need it.  We recommend signing up for the patient portal called "MyChart".  Sign up information is provided on this After Visit Summary.  MyChart is used to connect with patients for Virtual Visits (Telemedicine).  Patients are able to view lab/test results, encounter notes, upcoming appointments, etc.  Non-urgent messages can be sent to your provider as well.   To learn more about what you can do with MyChart, go to [email or web address].    Your next appointment:   6 month(s)  The format for your next appointment:   In Person  Provider:   Shirlee More, MD{   Other Instructions None  Important Information About Sugar

## 2021-10-26 ENCOUNTER — Other Ambulatory Visit: Payer: Self-pay

## 2021-10-26 NOTE — Addendum Note (Signed)
Addended by: Shirlee More on: 10/26/2021 12:49 PM   Modules accepted: Orders

## 2021-10-27 ENCOUNTER — Ambulatory Visit (INDEPENDENT_AMBULATORY_CARE_PROVIDER_SITE_OTHER): Payer: Medicare Other

## 2021-10-27 DIAGNOSIS — E7800 Pure hypercholesterolemia, unspecified: Secondary | ICD-10-CM | POA: Diagnosis not present

## 2021-10-27 DIAGNOSIS — I2510 Atherosclerotic heart disease of native coronary artery without angina pectoris: Secondary | ICD-10-CM

## 2021-10-27 LAB — MYOCARDIAL PERFUSION IMAGING
Angina Index: 0
Estimated workload: 10.1
Exercise duration (min): 9 min
Exercise duration (sec): 31 s
LV dias vol: 100 mL (ref 62–150)
LV sys vol: 45 mL
MPHR: 150 {beats}/min
Nuc Stress EF: 55 %
Peak HR: 133 {beats}/min
Percent HR: 88 %
Rest HR: 50 {beats}/min
Rest Nuclear Isotope Dose: 10.1 mCi
SDS: 7
SRS: 1
SSS: 8
Stress Nuclear Isotope Dose: 30.3 mCi
TID: 1.03

## 2021-10-27 MED ORDER — TECHNETIUM TC 99M TETROFOSMIN IV KIT
30.3000 | PACK | Freq: Once | INTRAVENOUS | Status: AC | PRN
  Administered 2021-10-27: 30.3 via INTRAVENOUS

## 2021-10-27 MED ORDER — TECHNETIUM TC 99M TETROFOSMIN IV KIT
10.1000 | PACK | Freq: Once | INTRAVENOUS | Status: AC | PRN
  Administered 2021-10-27: 10.1 via INTRAVENOUS

## 2021-10-30 ENCOUNTER — Telehealth: Payer: Self-pay | Admitting: Cardiology

## 2021-10-30 NOTE — Telephone Encounter (Signed)
Patient call back to get results.

## 2021-10-30 NOTE — Telephone Encounter (Signed)
Patient called to get test results. 

## 2021-10-30 NOTE — Telephone Encounter (Signed)
Patient is requesting to discuss stress test results. 

## 2021-10-30 NOTE — Telephone Encounter (Signed)
Pt calling to speak with nurse regarding Stress Test results. Please advise

## 2021-11-02 NOTE — Telephone Encounter (Signed)
Patient informed of results.  

## 2021-11-03 ENCOUNTER — Ambulatory Visit (INDEPENDENT_AMBULATORY_CARE_PROVIDER_SITE_OTHER): Payer: Medicare Other | Admitting: Cardiology

## 2021-11-03 ENCOUNTER — Other Ambulatory Visit (HOSPITAL_COMMUNITY): Payer: Self-pay

## 2021-11-03 ENCOUNTER — Other Ambulatory Visit: Payer: Self-pay

## 2021-11-03 ENCOUNTER — Encounter: Payer: Self-pay | Admitting: Cardiology

## 2021-11-03 VITALS — BP 130/74 | HR 73 | Ht 72.0 in | Wt 178.4 lb

## 2021-11-03 DIAGNOSIS — R9439 Abnormal result of other cardiovascular function study: Secondary | ICD-10-CM

## 2021-11-03 DIAGNOSIS — I2510 Atherosclerotic heart disease of native coronary artery without angina pectoris: Secondary | ICD-10-CM | POA: Diagnosis not present

## 2021-11-03 DIAGNOSIS — R6889 Other general symptoms and signs: Secondary | ICD-10-CM | POA: Diagnosis not present

## 2021-11-03 HISTORY — DX: Abnormal result of other cardiovascular function study: R94.39

## 2021-11-03 MED ORDER — ROSUVASTATIN CALCIUM 20 MG PO TABS: 20.0000 mg | ORAL_TABLET | Freq: Every day | ORAL | 3 refills | Status: DC

## 2021-11-03 NOTE — Patient Instructions (Signed)
Medication Instructions:  STOP: Simvastatin  START: Rosuvastatin '20mg'$  1 tablet daily by mouth     *If you need a refill on your cardiac medications before your next appointment, please call your pharmacy*   Lab Work: BMP & CBC Today- In preparation for Cath  If you have labs (blood work) drawn today and your tests are completely normal, you will receive your results only by: [place] (if you have MyChart) OR A paper copy in the mail If you have any lab test that is abnormal or we need to change your treatment, we will call you to review the results.   Testing/Procedures:  [place] [address] Dept: [phone] Loc: [phone]  Dylan Mcdowell  11/03/2021  You are scheduled for a Cardiac Catheterization on Thursday, August 17 with Dr. Lenna Sciara.  1. Please arrive at the Main Entrance A at [facility]: [place], [place] [address] at 5:30 AM (This time is two hours before your procedure to ensure your preparation). Free valet parking service is available.   Special note: Every effort is made to have your procedure done on time. Please understand that emergencies sometimes delay scheduled procedures.  2. Diet: Do not eat solid foods after midnight.  You may have clear liquids until 5 AM upon the day of the procedure.  3. Labs: You will need to have blood drawn on Tuesday, August 15 at [facility]: [address], Technical sales engineer . You do not need to be fasting.  4. Medication instructions in preparation for your procedure:   Contrast Allergy: No   On the morning of your procedure, take Aspirin and any morning medicines NOT listed above.  You may use sips of water.  5. Plan to go home the same day, you will only stay overnight if medically necessary. 6. You MUST have a responsible adult to drive you home. 7. An adult MUST be with you the  first 24 hours after you arrive home. 8. Bring a current list of your medications, and the last time and date medication taken. 9. Bring ID and current insurance cards. 10.Please wear clothes that are easy to get on and off and wear slip-on shoes.  Thank you for allowing [place] to care for you!   -- [facility]    Follow-Up: At [facility], you and your health needs are our priority.  As part of our continuing mission to provide you with exceptional heart care, we have created designated Provider Care Teams.  These Care Teams include your primary Cardiologist (physician) and Advanced Practice Providers (APPs -  Physician Assistants and Nurse Practitioners) who all work together to provide you with the care you need, when you need it.  We recommend signing up for the patient portal called "MyChart".  Sign up information is provided on this After Visit Summary.  MyChart is used to connect with patients for Virtual Visits (Telemedicine).  Patients are able to view lab/test results, encounter notes, upcoming appointments, etc.  Non-urgent messages can be sent to your provider as well.   To learn more about what you can do with MyChart, go to [email or web address].    Your next appointment:   3 month(s)  The format for your next appointment:   In Person  Provider:   Jenne Campus, MD   Other Instructions  Coronary Angiogram With Stent Coronary angiogram with stent placement is a  procedure to widen or open a narrow blood vessel of the heart (coronary artery). Arteries may become blocked by cholesterol buildup (plaques) in the lining of the artery wall. When a coronary artery becomes partially blocked, blood flow to that area decreases. This may lead to chest pain or a heart attack (myocardial infarction). A stent is a small piece of metal that looks like mesh or spring. Stent placement may be done as treatment after a heart attack, or to prevent a heart  attack if a blocked artery is found by a coronary angiogram. Let your health care provider know about: Any allergies you have, including allergies to medicines or contrast dye. All medicines you are taking, including vitamins, herbs, eye drops, creams, and over-the-counter medicines. Any problems you or family members have had with anesthetic medicines. Any blood disorders you have. Any surgeries you have had. Any medical conditions you have, including kidney problems or kidney failure. Whether you are pregnant or may be pregnant. Whether you are breastfeeding. What are the risks? Generally, this is a safe procedure. However, serious problems may occur, including: Damage to nearby structures or organs, such as the heart, blood vessels, or kidneys. A return of blockage. Bleeding, infection, or bruising at the insertion site. A collection of blood under the skin (hematoma) at the insertion site. A blood clot in another part of the body. Allergic reaction to medicines or dyes. Bleeding into the abdomen (retroperitoneal bleeding). Stroke (rare). Heart attack (rare). What happens before the procedure? Staying hydrated Follow instructions from your health care provider about hydration, which may include: Up to 2 hours before the procedure - you may continue to drink clear liquids, such as water, clear fruit juice, black coffee, and plain tea.    Eating and drinking restrictions Follow instructions from your health care provider about eating and drinking, which may include: 8 hours before the procedure - stop eating heavy meals or foods, such as meat, fried foods, or fatty foods. 6 hours before the procedure - stop eating light meals or foods, such as toast or cereal. 2 hours before the procedure - stop drinking clear liquids. Medicines Ask your health care provider about: Changing or stopping your regular medicines. This is especially important if you are taking diabetes medicines or blood  thinners. Taking medicines such as aspirin and ibuprofen. These medicines can thin your blood. Do not take these medicines unless your health care provider tells you to take them. Generally, aspirin is recommended before a thin tube, called a catheter, is passed through a blood vessel and inserted into the heart (cardiac catheterization). Taking over-the-counter medicines, vitamins, herbs, and supplements. General instructions Do not use any products that contain nicotine or tobacco for at least 4 weeks before the procedure. These products include cigarettes, e-cigarettes, and chewing tobacco. If you need help quitting, ask your health care provider. Plan to have someone take you home from the hospital or clinic. If you will be going home right after the procedure, plan to have someone with you for 24 hours. You may have tests and imaging procedures. Ask your health care provider: How your insertion site will be marked. Ask which artery will be used for the procedure. What steps will be taken to help prevent infection. These may include: Removing hair at the insertion site. Washing skin with a germ-killing soap. Taking antibiotic medicine. What happens during the procedure? An IV will be inserted into one of your veins. Electrodes may be placed on your chest to monitor your  heart rate during the procedure. You will be given one or more of the following: A medicine to help you relax (sedative). A medicine to numb the area (local anesthetic) for catheter insertion. A small incision will be made for catheter insertion. The catheter will be inserted into an artery using a guide wire. The location may be in your groin, your wrist, or the fold of your arm (near your elbow). An X-ray procedure (fluoroscopy) will be used to help guide the catheter to the opening of the heart arteries. A dye will be injected into the catheter. X-rays will be taken. The dye helps to show where any narrowing or  blockages are located in the arteries. Tell your health care provider if you have chest pain or trouble breathing. A tiny wire will be guided to the blocked spot, and a balloon will be inflated to make the artery wider. The stent will be expanded to crush the plaques into the wall of the vessel. The stent will hold the area open and improve the blood flow. Most stents have a drug coating to reduce the risk of the stent narrowing over time. The artery may be made wider using a drill, laser, or other tools that remove plaques. The catheter will be removed when the blood flow improves. The stent will stay where it was placed, and the lining of the artery will grow over it. A bandage (dressing) will be placed on the insertion site. Pressure will be applied to stop bleeding. The IV will be removed. This procedure may vary among health care providers and hospitals.    What happens after the procedure? Your blood pressure, heart rate, breathing rate, and blood oxygen level will be monitored until you leave the hospital or clinic. If the procedure is done through the leg, you will lie flat in bed for a few hours or for as long as told by your health care provider. You will be instructed not to bend or cross your legs. The insertion site and the pulse in your foot or wrist will be checked often. You may have more blood tests, X-rays, and a test that records the electrical activity of your heart (electrocardiogram, or ECG). Do not drive for 24 hours if you were given a sedative during your procedure. Summary Coronary angiogram with stent placement is a procedure to widen or open a narrowed coronary artery. This is done to treat heart problems. Before the procedure, let your health care provider know about all the medical conditions and surgeries you have or have had. This is a safe procedure. However, some problems may occur, including damage to nearby structures or organs, bleeding, blood clots, or  allergies. Follow your health care provider's instructions about eating, drinking, medicines, and other lifestyle changes, such as quitting tobacco use before the procedure. This information is not intended to replace advice given to you by your health care provider. Make sure you discuss any questions you have with your health care provider. Document Revised: 09/27/2018 Document Reviewed: 09/27/2018 Elsevier Patient Education  [place].

## 2021-11-03 NOTE — Progress Notes (Signed)
Cardiology Office Note:    Date:  11/03/2021   ID:  Cy Blamer, DOB [birth year], MRN [id number]  PCP:  Esperanza Richters, NP  Cardiologist:  Jenne Campus, MD    Referring MD: Esperanza Richters, NP   Chief Complaint  Patient presents with   Follow-up    History of Present Illness:    Dylan Mcdowell is a 71 y.o. male with past medical history significant for coronary artery disease he does have history of remote PCI which happened in 2001 then he required some [facility], that he got some recurrent stenosis and intervention done at [facility] have no documentation of those events.  I am not sure exactly which artery was taking care of however he was asymptomatic and no recurrence of symptoms for about 20 years.  He does have also history of essential hypertension hyperlipidemia carotic arterial disease.  Last time he seen my partner Dr. Bettina Gavia in August and he was find to have decrease in exercise tolerance without chest pain tightness squeezing pressure burning chest, however, previously when he gets symptoms also are very atypical.  Therefore stress test has been ordered.  Stress test is abnormal showing ischemia involving inferior wall and he is in my office to talk about options.  He truly described the fact that his ability to exercise significantly limited.  He is very active he tried to walk he tried to exercise on the regular basis but now have to do much less because of inability to continue.  He denies having typical chest pain tightness squeezing pressure burning chest.  Past Medical History:  Diagnosis Date   Altered bowel habits 05/19/2016   Atherosclerotic heart disease of native coronary artery without angina pectoris    Benign prostatic hyperplasia with urinary obstruction 10/03/2014   Bursitis of left shoulder 07/01/2020   Carotid artery stenosis    Cervical myelopathy (HCC) 06/26/2020   Chronic  eczematous otitis externa of right ear 01/24/2017   Formatting of this note might be different from the original. Last Assessment & Plan:  Formatting of this note might be different from the original. Concern over itching and discomfort in the ears. Chronic history of the last 6 months or so of intermittent symptoms as above.  Worse on the right side.  No recent treatment. 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 Last Assessment & Plan:  Formatting of this note might be different from the original. Concern over nasal obstruction at night. Chronic history.  No infectious sounding symptoms.  Year-round.  Really has not tried any medications.  He had septal surgery in the past. EXAM shows nasal septum slightly more to the left than the right but no   Hypothyroidism    Low back pain 08/06/2019   Formatting of this note might be different from the original. Description:   Lumbar radiculopathy 06/26/2020   Nocturia 12/15/2018   Other chronic pain 07/01/2020   Peripheral vascular disease ([place])  Prostate cancer ([place]) 10/24/2014   Subacromial bursitis of both shoulders 07/01/2020   Ulcerative colitis, unspecified, without complications ([place]) 73/22/0254    Past Surgical History:  Procedure Laterality Date   APPENDECTOMY  1965   CORONARY STENT PLACEMENT  2001   HEMORROIDECTOMY     HERNIA REPAIR  1988   NASAL SEPTUM SURGERY     PROSTATECTOMY     UVULECTOMY     due to snoring     Current Medications: Current Meds  Medication Sig   Ascorbic Acid 500 MG CHEW Chew 500 mg by mouth daily.   aspirin EC 81 MG tablet Take 1 tablet (81 mg total) by mouth daily. Swallow whole.   Cholecalciferol 25 MCG (1000 UT) capsule Take 1,000 Units by mouth daily.   ezetimibe (ZETIA) 10 MG tablet Take 10 mg by mouth daily.   folic acid (FOLVITE) 270 MCG tablet Take 800 mcg by mouth daily.   levothyroxine (SYNTHROID) 75 MCG tablet Take 75 mcg by mouth daily.   melatonin 5 MG TABS Take 5 mg by mouth at bedtime as needed for sleep.   Multiple Vitamin (MULTI-VITAMIN) tablet Take 2 tablets by mouth daily.   niacin 500 MG tablet Take 500 mg by mouth daily.   Omega-3 Fatty Acids (FISH OIL) 1000 MG CAPS Take 1 capsule by mouth daily.   rosuvastatin (CRESTOR) 20 MG tablet Take 1 tablet (20 mg total) by mouth daily.   tamsulosin (FLOMAX) 0.4 MG CAPS capsule Take 0.4 mg by mouth daily.   [DISCONTINUED] simvastatin (ZOCOR) 40 MG tablet Take 40 mg by mouth daily.     Allergies:   Patient has no known allergies.   Social History   Socioeconomic History   Marital status: Married    Spouse name: Not on file   Number of children: Not on file   Years of education: Not on file   Highest education level: Not on file  Occupational History   Not on file  Tobacco Use   Smoking status: Never    Passive exposure: Past   Smokeless tobacco: Never  Vaping Use   Vaping Use: Never used  Substance and Sexual Activity   Alcohol use: Yes    Comment: once a month   Drug use: Never   Sexual activity: Not on file  Other Topics Concern   Not on file  Social History Narrative   Not on file   Social Determinants of Health   Financial Resource Strain: Not on file  Food Insecurity: Not on file  Transportation Needs: Not on file  Physical Activity: Not on file  Stress: Not on file  Social Connections: Not on file     Family History: The patient's family history includes Alzheimer's disease in  his mother; Heart attack in his brother, brother, and father. ROS:   Please see the history of present illness.    All 14 point review of systems negative except as described per history of present illness  EKGs/Labs/Other Studies Reviewed:      Recent Labs: No results found for requested labs within last 365 days.  Recent Lipid Panel No results found for: "CHOL", "TRIG", "HDL", "CHOLHDL", "VLDL", "LDLCALC", "LDLDIRECT"  Physical Exam:    VS:  BP 130/74 (BP Location: Right Arm, Patient Position: Sitting)   Pulse 73   Ht 6' (1.829 m)   Wt 178 lb 6.4 oz (80.9 kg)   SpO2 93%   BMI 24.20 kg/m     Wt Readings from Last 3 Encounters:  11/03/21 178 lb 6.4 oz (80.9 kg)  10/27/21 179 lb (81.2 kg)  10/20/21 179 lb 12.8 oz (81.6 kg)     GEN:  Well nourished, well developed in no acute distress HEENT: Normal NECK: No JVD; No carotid bruits LYMPHATICS: No lymphadenopathy CARDIAC: RRR, no murmurs, no rubs, no gallops RESPIRATORY:  Clear to auscultation without rales, wheezing or rhonchi  ABDOMEN: Soft, non-tender, non-distended MUSCULOSKELETAL:  No edema; No deformity  SKIN: Warm and dry LOWER EXTREMITIES: no swelling NEUROLOGIC:  Alert and oriented x 3 PSYCHIATRIC:  Normal affect   ASSESSMENT:    1. Coronary artery disease involving native coronary artery of native heart without angina pectoris   2. Abnormal stress test   3. Decreased functional activity tolerance   4. Atherosclerosis of native coronary artery of native heart without angina pectoris    PLAN:    In order of problems listed above:  Abnormal stress test.  Inferior wall ischemia symptoms are very worrisome.  Decreased exercise tolerance and doses similar to the symptoms that he developed years ago when he required multiple angioplasties.  We spent great deal of time talking about options for this situation we talked about potential medical therapy as well as cardiac catheterization.  He favor cardiac  catheterization.  He make that decision especially in view of the fact that he likes to be very active and now because of his symptomatology can on top of that his daughter is starting a protein sparing he is planning to go to in November he want to make sure it safe for him to go there.  We discussed cardiac catheterization including all risks benefits as well as alternatives.  We will proceed.  He is already on antiplatelet therapy, I did review his cholesterol however is not sufficiently controlled with LDL of 115 from K PN from September 16, 2021.  She is a he is on simvastatin.  He remembers being on atorvastatin and having some difficulty with this medication, therefore, I will switch him to Crestor 20 hopefully he will be able to tolerate that if not and then will consider PCSK9 agent. He is wife was present during our discussion about cardiac catheterization we will proceed.   Medication Adjustments/Labs and Tests Ordered: Current medicines are reviewed at length with the patient today.  Concerns regarding medicines are outlined above.  Orders Placed This Encounter  Procedures   Basic metabolic panel   CBC   EKG 12-Lead   Medication changes:  Meds ordered this encounter  Medications   rosuvastatin (CRESTOR) 20 MG tablet    Sig: Take 1 tablet (20 mg total) by mouth daily.    Dispense:  90 tablet    Refill:  3    Signed, Park Liter, MD, [facility] 11/03/2021 3:43 PM    [place]

## 2021-11-03 NOTE — H&P (View-Only) (Signed)
Cardiology Office Note:    Date:  11/03/2021   ID:  Dylan Mcdowell, DOB [birth year], MRN [id number]  PCP:  Esperanza Richters, NP  Cardiologist:  Jenne Campus, MD    Referring MD: Esperanza Richters, NP   Chief Complaint  Patient presents with   Follow-up    History of Present Illness:    Dylan Mcdowell is a 71 y.o. male with past medical history significant for coronary artery disease he does have history of remote PCI which happened in 2001 then he required some [facility], that he got some recurrent stenosis and intervention done at [facility] have no documentation of those events.  I am not sure exactly which artery was taking care of however he was asymptomatic and no recurrence of symptoms for about 20 years.  He does have also history of essential hypertension hyperlipidemia carotic arterial disease.  Last time he seen my partner Dr. Bettina Gavia in August and he was find to have decrease in exercise tolerance without chest pain tightness squeezing pressure burning chest, however, previously when he gets symptoms also are very atypical.  Therefore stress test has been ordered.  Stress test is abnormal showing ischemia involving inferior wall and he is in my office to talk about options.  He truly described the fact that his ability to exercise significantly limited.  He is very active he tried to walk he tried to exercise on the regular basis but now have to do much less because of inability to continue.  He denies having typical chest pain tightness squeezing pressure burning chest.  Past Medical History:  Diagnosis Date   Altered bowel habits 05/19/2016   Atherosclerotic heart disease of native coronary artery without angina pectoris    Benign prostatic hyperplasia with urinary obstruction 10/03/2014   Bursitis of left shoulder 07/01/2020   Carotid artery stenosis    Cervical myelopathy (HCC) 06/26/2020   Chronic  eczematous otitis externa of right ear 01/24/2017   Formatting of this note might be different from the original. Last Assessment & Plan:  Formatting of this note might be different from the original. Concern over itching and discomfort in the ears. Chronic history of the last 6 months or so of intermittent symptoms as above.  Worse on the right side.  No recent treatment. 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 Last Assessment & Plan:  Formatting of this note might be different from the original. Concern over nasal obstruction at night. Chronic history.  No infectious sounding symptoms.  Year-round.  Really has not tried any medications.  He had septal surgery in the past. EXAM shows nasal septum slightly more to the left than the right but no   Hypothyroidism    Low back pain 08/06/2019   Formatting of this note might be different from the original. Description:   Lumbar radiculopathy 06/26/2020   Nocturia 12/15/2018   Other chronic pain 07/01/2020   Peripheral vascular disease ([place])  Prostate cancer ([place]) 10/24/2014   Subacromial bursitis of both shoulders 07/01/2020   Ulcerative colitis, unspecified, without complications ([place]) 24/40/1027    Past Surgical History:  Procedure Laterality Date   APPENDECTOMY  1965   CORONARY STENT PLACEMENT  2001   HEMORROIDECTOMY     HERNIA REPAIR  1988   NASAL SEPTUM SURGERY     PROSTATECTOMY     UVULECTOMY     due to snoring     Current Medications: Current Meds  Medication Sig   Ascorbic Acid 500 MG CHEW Chew 500 mg by mouth daily.   aspirin EC 81 MG tablet Take 1 tablet (81 mg total) by mouth daily. Swallow whole.   Cholecalciferol 25 MCG (1000 UT) capsule Take 1,000 Units by mouth daily.   ezetimibe (ZETIA) 10 MG tablet Take 10 mg by mouth daily.   folic acid (FOLVITE) 253 MCG tablet Take 800 mcg by mouth daily.   levothyroxine (SYNTHROID) 75 MCG tablet Take 75 mcg by mouth daily.   melatonin 5 MG TABS Take 5 mg by mouth at bedtime as needed for sleep.   Multiple Vitamin (MULTI-VITAMIN) tablet Take 2 tablets by mouth daily.   niacin 500 MG tablet Take 500 mg by mouth daily.   Omega-3 Fatty Acids (FISH OIL) 1000 MG CAPS Take 1 capsule by mouth daily.   rosuvastatin (CRESTOR) 20 MG tablet Take 1 tablet (20 mg total) by mouth daily.   tamsulosin (FLOMAX) 0.4 MG CAPS capsule Take 0.4 mg by mouth daily.   [DISCONTINUED] simvastatin (ZOCOR) 40 MG tablet Take 40 mg by mouth daily.     Allergies:   Patient has no known allergies.   Social History   Socioeconomic History   Marital status: Married    Spouse name: Not on file   Number of children: Not on file   Years of education: Not on file   Highest education level: Not on file  Occupational History   Not on file  Tobacco Use   Smoking status: Never    Passive exposure: Past   Smokeless tobacco: Never  Vaping Use   Vaping Use: Never used  Substance and Sexual Activity   Alcohol use: Yes    Comment: once a month   Drug use: Never   Sexual activity: Not on file  Other Topics Concern   Not on file  Social History Narrative   Not on file   Social Determinants of Health   Financial Resource Strain: Not on file  Food Insecurity: Not on file  Transportation Needs: Not on file  Physical Activity: Not on file  Stress: Not on file  Social Connections: Not on file     Family History: The patient's family history includes Alzheimer's disease in  his mother; Heart attack in his brother, brother, and father. ROS:   Please see the history of present illness.    All 14 point review of systems negative except as described per history of present illness  EKGs/Labs/Other Studies Reviewed:      Recent Labs: No results found for requested labs within last 365 days.  Recent Lipid Panel No results found for: "CHOL", "TRIG", "HDL", "CHOLHDL", "VLDL", "LDLCALC", "LDLDIRECT"  Physical Exam:    VS:  BP 130/74 (BP Location: Right Arm, Patient Position: Sitting)   Pulse 73   Ht 6' (1.829 m)   Wt 178 lb 6.4 oz (80.9 kg)   SpO2 93%   BMI 24.20 kg/m     Wt Readings from Last 3 Encounters:  11/03/21 178 lb 6.4 oz (80.9 kg)  10/27/21 179 lb (81.2 kg)  10/20/21 179 lb 12.8 oz (81.6 kg)     GEN:  Well nourished, well developed in no acute distress HEENT: Normal NECK: No JVD; No carotid bruits LYMPHATICS: No lymphadenopathy CARDIAC: RRR, no murmurs, no rubs, no gallops RESPIRATORY:  Clear to auscultation without rales, wheezing or rhonchi  ABDOMEN: Soft, non-tender, non-distended MUSCULOSKELETAL:  No edema; No deformity  SKIN: Warm and dry LOWER EXTREMITIES: no swelling NEUROLOGIC:  Alert and oriented x 3 PSYCHIATRIC:  Normal affect   ASSESSMENT:    1. Coronary artery disease involving native coronary artery of native heart without angina pectoris   2. Abnormal stress test   3. Decreased functional activity tolerance   4. Atherosclerosis of native coronary artery of native heart without angina pectoris    PLAN:    In order of problems listed above:  Abnormal stress test.  Inferior wall ischemia symptoms are very worrisome.  Decreased exercise tolerance and doses similar to the symptoms that he developed years ago when he required multiple angioplasties.  We spent great deal of time talking about options for this situation we talked about potential medical therapy as well as cardiac catheterization.  He favor cardiac  catheterization.  He make that decision especially in view of the fact that he likes to be very active and now because of his symptomatology can on top of that his daughter is starting a protein sparing he is planning to go to in November he want to make sure it safe for him to go there.  We discussed cardiac catheterization including all risks benefits as well as alternatives.  We will proceed.  He is already on antiplatelet therapy, I did review his cholesterol however is not sufficiently controlled with LDL of 115 from K PN from September 16, 2021.  She is a he is on simvastatin.  He remembers being on atorvastatin and having some difficulty with this medication, therefore, I will switch him to Crestor 20 hopefully he will be able to tolerate that if not and then will consider PCSK9 agent. He is wife was present during our discussion about cardiac catheterization we will proceed.   Medication Adjustments/Labs and Tests Ordered: Current medicines are reviewed at length with the patient today.  Concerns regarding medicines are outlined above.  Orders Placed This Encounter  Procedures   Basic metabolic panel   CBC   EKG 12-Lead   Medication changes:  Meds ordered this encounter  Medications   rosuvastatin (CRESTOR) 20 MG tablet    Sig: Take 1 tablet (20 mg total) by mouth daily.    Dispense:  90 tablet    Refill:  3    Signed, Park Liter, MD, [facility] 11/03/2021 3:43 PM    [place]

## 2021-11-04 ENCOUNTER — Telehealth: Payer: Self-pay | Admitting: *Deleted

## 2021-11-04 LAB — BASIC METABOLIC PANEL
BUN/Creatinine Ratio: 28 — ABNORMAL HIGH (ref 10–24)
BUN: 30 mg/dL — ABNORMAL HIGH (ref 8–27)
CO2: 21 mmol/L (ref 20–29)
Calcium: 9.3 mg/dL (ref 8.6–10.2)
Chloride: 107 mmol/L — ABNORMAL HIGH (ref 96–106)
Creatinine, Ser: 1.08 mg/dL (ref 0.76–1.27)
Glucose: 115 mg/dL — ABNORMAL HIGH (ref 70–99)
Potassium: 4.1 mmol/L (ref 3.5–5.2)
Sodium: 143 mmol/L (ref 134–144)
eGFR: 74 mL/min/{1.73_m2} (ref >59)

## 2021-11-04 LAB — CBC
Hematocrit: 40.7 % (ref 37.5–51.0)
Hemoglobin: 13.5 g/dL (ref 13.0–17.7)
MCH: 28.5 pg (ref 26.6–33.0)
MCHC: 33.2 g/dL (ref 31.5–35.7)
MCV: 86 fL (ref 79–97)
Platelets: 213 10*3/uL (ref 150–450)
RBC: 4.73 x10E6/uL (ref 4.14–5.80)
RDW: 12.8 % (ref 11.6–15.4)
WBC: 5.9 10*3/uL (ref 3.4–10.8)

## 2021-11-04 NOTE — Telephone Encounter (Signed)
Call placed to patient to review instructions with patient, voicemail full, unable to leave message.

## 2021-11-04 NOTE — Telephone Encounter (Signed)
Call placed to patient to review instructions, mailbox full.

## 2021-11-04 NOTE — Telephone Encounter (Signed)
Cardiac Catheterization scheduled at [facility] for: Thursday November 05, 2021 7:30 AM Arrival time and place: [place] Entrance A at: 5:30 AM   Nothing to eat after midnight prior to procedure, clear liquids until 5 AM day of procedure.  Medication instructions: -Usual morning medications can be taken with sips of water including aspirin 81 mg.  Confirmed patient has responsible adult to drive home post procedure and be with patient first 24 hours after arriving home.  Patient reports no new symptoms concerning for COVID-19 in the past 10 days.  Call placed to patient to review procedure instructions, voicemail full, unable to leave message.

## 2021-11-05 ENCOUNTER — Other Ambulatory Visit: Payer: Self-pay

## 2021-11-05 ENCOUNTER — Other Ambulatory Visit (HOSPITAL_COMMUNITY): Payer: Self-pay

## 2021-11-05 ENCOUNTER — Ambulatory Visit (HOSPITAL_COMMUNITY)
Admission: RE | Admit: 2021-11-05 | Discharge: 2021-11-05 | Disposition: A | Discharge status: Home / Self Care | Payer: Medicare Other | Source: Ambulatory Visit | Attending: Internal Medicine | Admitting: Internal Medicine

## 2021-11-05 ENCOUNTER — Encounter (HOSPITAL_COMMUNITY)
Admission: RE | Disposition: A | Discharge status: Home / Self Care | Payer: Self-pay | Source: Ambulatory Visit | Attending: Internal Medicine

## 2021-11-05 DIAGNOSIS — R9439 Abnormal result of other cardiovascular function study: Secondary | ICD-10-CM | POA: Insufficient documentation

## 2021-11-05 DIAGNOSIS — I2510 Atherosclerotic heart disease of native coronary artery without angina pectoris: Secondary | ICD-10-CM | POA: Diagnosis present

## 2021-11-05 DIAGNOSIS — I2584 Coronary atherosclerosis due to calcified coronary lesion: Secondary | ICD-10-CM | POA: Diagnosis not present

## 2021-11-05 DIAGNOSIS — E7800 Pure hypercholesterolemia, unspecified: Secondary | ICD-10-CM | POA: Diagnosis not present

## 2021-11-05 DIAGNOSIS — I10 Essential (primary) hypertension: Secondary | ICD-10-CM | POA: Diagnosis not present

## 2021-11-05 DIAGNOSIS — Z7722 Contact with and (suspected) exposure to environmental tobacco smoke (acute) (chronic): Secondary | ICD-10-CM | POA: Insufficient documentation

## 2021-11-05 DIAGNOSIS — I25119 Atherosclerotic heart disease of native coronary artery with unspecified angina pectoris: Secondary | ICD-10-CM | POA: Diagnosis not present

## 2021-11-05 DIAGNOSIS — I209 Angina pectoris, unspecified: Secondary | ICD-10-CM | POA: Diagnosis present

## 2021-11-05 DIAGNOSIS — Z955 Presence of coronary angioplasty implant and graft: Secondary | ICD-10-CM

## 2021-11-05 HISTORY — PX: CORONARY STENT INTERVENTION: CATH118234

## 2021-11-05 HISTORY — PX: CORONARY ATHERECTOMY: CATH118238

## 2021-11-05 HISTORY — PX: INTRAVASCULAR IMAGING/OCT: CATH118326

## 2021-11-05 HISTORY — PX: LEFT HEART CATH AND CORONARY ANGIOGRAPHY: CATH118249

## 2021-11-05 LAB — POCT ACTIVATED CLOTTING TIME
Activated Clotting Time: 227 seconds
Activated Clotting Time: 251 seconds
Activated Clotting Time: 299 seconds
Activated Clotting Time: 287 seconds
Activated Clotting Time: 323 seconds
Activated Clotting Time: 269 seconds

## 2021-11-05 SURGERY — LEFT HEART CATH AND CORONARY ANGIOGRAPHY
Anesthesia: LOCAL

## 2021-11-05 MED ORDER — FENTANYL CITRATE (PF) 100 MCG/2ML IJ SOLN
INTRAMUSCULAR | Status: DC | PRN
  Administered 2021-11-05 (×4): 25 ug via INTRAVENOUS

## 2021-11-05 MED ORDER — LABETALOL HCL 5 MG/ML IV SOLN: 10.0000 mg | INTRAVENOUS | Status: AC | PRN

## 2021-11-05 MED ORDER — LIDOCAINE HCL (PF) 1 % IJ SOLN
INTRAMUSCULAR | Status: DC | PRN
  Administered 2021-11-05: 2 mL

## 2021-11-05 MED ORDER — MIDAZOLAM HCL 2 MG/2ML IJ SOLN
INTRAMUSCULAR | Status: DC | PRN
  Administered 2021-11-05 (×4): 1 mg via INTRAVENOUS

## 2021-11-05 MED ORDER — NITROGLYCERIN 1 MG/10 ML FOR IR/CATH LAB
INTRA_ARTERIAL | Status: DC | PRN
  Administered 2021-11-05 (×2): 200 ug via INTRACORONARY

## 2021-11-05 MED ORDER — ASPIRIN 81 MG PO TBEC
81.0000 mg | DELAYED_RELEASE_TABLET | Freq: Every day | ORAL | 0 refills | Status: AC
  Filled 2021-11-05: qty 30, 30d supply, fill #0

## 2021-11-05 MED ORDER — ASPIRIN 81 MG PO CHEW: 81.0000 mg | CHEWABLE_TABLET | Freq: Every day | ORAL | Status: DC

## 2021-11-05 MED ORDER — HEPARIN (PORCINE) IN NACL 1000-0.9 UT/500ML-% IV SOLN
INTRAVENOUS | Status: DC | PRN
  Administered 2021-11-05 (×2): 500 mL

## 2021-11-05 MED ORDER — HEPARIN SODIUM (PORCINE) 1000 UNIT/ML IJ SOLN
INTRAMUSCULAR | Status: AC
  Filled 2021-11-05: qty 10

## 2021-11-05 MED ORDER — ACETAMINOPHEN 325 MG PO TABS: 650.0000 mg | ORAL_TABLET | ORAL | Status: DC | PRN

## 2021-11-05 MED ORDER — CLOPIDOGREL BISULFATE 300 MG PO TABS
ORAL_TABLET | ORAL | Status: AC
  Filled 2021-11-05: qty 1

## 2021-11-05 MED ORDER — SODIUM CHLORIDE 0.9% FLUSH: 3.0000 mL | INTRAVENOUS | Status: DC | PRN

## 2021-11-05 MED ORDER — VERAPAMIL HCL 2.5 MG/ML IV SOLN
INTRAVENOUS | Status: AC
  Filled 2021-11-05: qty 2

## 2021-11-05 MED ORDER — ONDANSETRON HCL 4 MG/2ML IJ SOLN: 4.0000 mg | Freq: Four times a day (QID) | INTRAMUSCULAR | Status: DC | PRN

## 2021-11-05 MED ORDER — SODIUM CHLORIDE 0.9 % WEIGHT BASED INFUSION
1.0000 mL/kg/h | INTRAVENOUS | Status: DC
Start: 2021-11-05 — End: 2021-11-05

## 2021-11-05 MED ORDER — SODIUM CHLORIDE 0.9% FLUSH: 3.0000 mL | Freq: Two times a day (BID) | INTRAVENOUS | Status: DC

## 2021-11-05 MED ORDER — CLOPIDOGREL BISULFATE 75 MG PO TABS: 75.0000 mg | ORAL_TABLET | Freq: Every day | ORAL | 1 refills | Status: DC

## 2021-11-05 MED ORDER — HEPARIN SODIUM (PORCINE) 1000 UNIT/ML IJ SOLN
INTRAMUSCULAR | Status: DC | PRN
  Administered 2021-11-05 (×3): 4000 [IU] via INTRAVENOUS
  Administered 2021-11-05: 5000 [IU] via INTRAVENOUS
  Administered 2021-11-05: 4000 [IU] via INTRAVENOUS
  Administered 2021-11-05: 3000 [IU] via INTRAVENOUS

## 2021-11-05 MED ORDER — SODIUM CHLORIDE 0.9 % IV SOLN: 250.0000 mL | INTRAVENOUS | Status: DC | PRN

## 2021-11-05 MED ORDER — VERAPAMIL HCL 2.5 MG/ML IV SOLN
INTRAVENOUS | Status: DC | PRN
  Administered 2021-11-05: 10 mL via INTRA_ARTERIAL

## 2021-11-05 MED ORDER — SODIUM CHLORIDE 0.9 % IV SOLN: INTRAVENOUS | Status: DC

## 2021-11-05 MED ORDER — SODIUM CHLORIDE 0.9 % WEIGHT BASED INFUSION
3.0000 mL/kg/h | INTRAVENOUS | Status: AC
  Administered 2021-11-05: 3 mL/kg/h via INTRAVENOUS

## 2021-11-05 MED ORDER — NITROGLYCERIN 0.4 MG SL SUBL: 0.4000 mg | SUBLINGUAL_TABLET | SUBLINGUAL | 2 refills | Status: DC | PRN

## 2021-11-05 MED ORDER — CLOPIDOGREL BISULFATE 75 MG PO TABS: 75.0000 mg | ORAL_TABLET | Freq: Every day | ORAL | Status: DC

## 2021-11-05 MED ORDER — LIDOCAINE HCL (PF) 1 % IJ SOLN
INTRAMUSCULAR | Status: AC
  Filled 2021-11-05: qty 30

## 2021-11-05 MED ORDER — FENTANYL CITRATE (PF) 100 MCG/2ML IJ SOLN
INTRAMUSCULAR | Status: AC
  Filled 2021-11-05: qty 2

## 2021-11-05 MED ORDER — CLOPIDOGREL BISULFATE 300 MG PO TABS
ORAL_TABLET | ORAL | Status: DC | PRN
  Administered 2021-11-05: 600 mg via ORAL

## 2021-11-05 MED ORDER — CLOPIDOGREL BISULFATE 75 MG PO TABS
75.0000 mg | ORAL_TABLET | Freq: Every day | ORAL | 0 refills | Status: DC
  Filled 2021-11-05: qty 30, 30d supply, fill #0

## 2021-11-05 MED ORDER — IOHEXOL 350 MG/ML SOLN
INTRAVENOUS | Status: DC | PRN
  Administered 2021-11-05: 165 mL

## 2021-11-05 MED ORDER — ASPIRIN 81 MG PO CHEW: 81.0000 mg | CHEWABLE_TABLET | ORAL | Status: DC

## 2021-11-05 MED ORDER — NITROGLYCERIN 1 MG/10 ML FOR IR/CATH LAB
INTRA_ARTERIAL | Status: AC
  Filled 2021-11-05: qty 10

## 2021-11-05 MED ORDER — HEPARIN (PORCINE) IN NACL 1000-0.9 UT/500ML-% IV SOLN
INTRAVENOUS | Status: AC
  Filled 2021-11-05: qty 1000

## 2021-11-05 MED ORDER — MIDAZOLAM HCL 2 MG/2ML IJ SOLN
INTRAMUSCULAR | Status: AC
  Filled 2021-11-05: qty 2

## 2021-11-05 SURGICAL SUPPLY — 36 items
BALL SAPPHIRE NC24 3.0X15 (BALLOONS) ×1
BALL SAPPHIRE NC24 3.5X18 (BALLOONS) ×1
BALL SAPPHIRE NC24 4.0X15 (BALLOONS) ×1
BALLN SAPPHIRE 2.0X12 (BALLOONS) ×1
BALLN SAPPHIRE 2.5X30 (BALLOONS) ×1
BALLOON SAPPHIRE 2.0X12 (BALLOONS) IMPLANT
BALLOON SAPPHIRE 2.5X30 (BALLOONS) IMPLANT
BALLOON SAPPHIRE NC24 3.0X15 (BALLOONS) IMPLANT
BALLOON SAPPHIRE NC24 3.5X18 (BALLOONS) IMPLANT
BALLOON SAPPHIRE NC24 4.0X15 (BALLOONS) IMPLANT
CATH DIAG 6FR JL4 (CATHETERS) IMPLANT
CATH DIAG 6FR JR4 (CATHETERS) IMPLANT
CATH DIAG 6FR PIGTAIL ANGLED (CATHETERS) IMPLANT
CATH DRAGONFLY OPSTAR (CATHETERS) IMPLANT
CATH TELEPORT (CATHETERS) IMPLANT
CATH VISTA GUIDE 6FR JR4 (CATHETERS) IMPLANT
CROWN DIAMONDBACK CLASSIC 1.25 (BURR) IMPLANT
DEVICE RAD COMP TR BAND LRG (VASCULAR PRODUCTS) IMPLANT
GLIDESHEATH SLEND SS 6F .021 (SHEATH) IMPLANT
GUIDEWIRE INQWIRE 1.5J.035X260 (WIRE) IMPLANT
GUIDEWIRE VAS SION BLUE 190 (WIRE) IMPLANT
INQWIRE 1.5J .035X260CM (WIRE) ×1
KIT ENCORE 26 ADVANTAGE (KITS) IMPLANT
KIT HEART LEFT (KITS) ×1 IMPLANT
LUBRICANT VIPERSLIDE CORONARY (MISCELLANEOUS) IMPLANT
PACK CARDIAC CATHETERIZATION (CUSTOM PROCEDURE TRAY) ×1 IMPLANT
STENT SYNERGY XD 2.75X32 (Permanent Stent) IMPLANT
STENT SYNERGY XD 3.0X32 (Permanent Stent) IMPLANT
SYNERGY XD 2.75X32 (Permanent Stent) ×1 IMPLANT
TRANSDUCER W/STOPCOCK (MISCELLANEOUS) ×1 IMPLANT
TUBING CIL FLEX 10 FLL-RA (TUBING) ×1 IMPLANT
VALVE GUARDIAN II ~~LOC~~ HEMO (MISCELLANEOUS) IMPLANT
WIRE ASAHI PROWATER 300CM (WIRE) IMPLANT
WIRE CHOICE GRAPHX PT 300 (WIRE) IMPLANT
WIRE HI TORQ WHISPER MS 190CM (WIRE) IMPLANT
WIRE VIPERWIRE COR FLEX .012 (WIRE) IMPLANT

## 2021-11-05 NOTE — Progress Notes (Signed)
Patient returned to [facility] with a hematoma to R arm. Elmo Putt, RCIS present and 2 cc's of air added to TR Band. Pressure held to site for 15 minutes. Hematoma resolved. Will continue to monitor.

## 2021-11-05 NOTE — Progress Notes (Signed)
TR BAND REMOVAL  LOCATION:    right radial  DEFLATED PER PROTOCOL:    Yes.    TIME BAND OFF / DRESSING APPLIED: 1645   SITE UPON ARRIVAL:    Level 0  SITE AFTER BAND REMOVAL:    Level 0  CIRCULATION SENSATION AND MOVEMENT:    Within Normal Limits   Yes.    COMMENTS:

## 2021-11-05 NOTE — Progress Notes (Signed)
At 1330, 3 cc's removed and instantly bled. This RN tried to add 1 cc back, but it continued to bleed. 3 cc's added back to band. At 1345, patient was bleeding significantly and 5 cc's had to be added back to band. Hematoma present. Pressure held. Freida Busman, Paramedic from cath lab came over at 1400 to re-adjust band. Pressure held and band re-adjusted at 1400 with 15 cc's of air. Hematoma resolved and no longer bleeding. Will continue to monitor.

## 2021-11-05 NOTE — Progress Notes (Signed)
Patient began bleeding at 1230 when 3 cc's removed. 2 cc's added back to band to stop bleeding. No hematoma present. At 1245 at re-check, patient still bleeding 1 cc added back to band to stop bleeding. No hematoma present. Patient stable. Will continue to monitor.

## 2021-11-05 NOTE — Progress Notes (Signed)
CARDIAC REHAB PHASE I     Pt resting well in chair with wife present. Post stent teaching including site care, heart health diet, exercise guidelines, risk factors, restrictions,antiplatelet therapy importance, and CRP2. All questions and concerns addressed. Will refer to [place] for CRP2. Plan for discharge today.   [id number] Vanessa Barbara, RN BSN 11/05/2021 1:38 PM

## 2021-11-05 NOTE — Progress Notes (Signed)
At 1300, patient still bleeding at site 1 cc of air added to band to stop bleeding. No hematoma. Will continue to monitor.

## 2021-11-05 NOTE — Discharge Summary (Addendum)
Discharge Summary for Same Day PCI   Patient ID: Lindsay Soulliere MRN: [id number]; DOB: Feb 04, 1951  Admit date: 11/05/2021 Discharge date: 11/05/2021  Primary Care Provider: Esperanza Richters, NP  Primary Cardiologist: Jenne Campus, MD  Primary Electrophysiologist:  None   Discharge Diagnoses    Principal Problem:   Coronary artery disease Active Problems:   Hypercholesterolemia  Diagnostic Studies/Procedures    Cardiac Catheterization 11/05/2021:    Prox LAD lesion is 15% stenosed.   Dist RCA-2 lesion is 80% stenosed.   RPAV lesion is 60% stenosed.   RPDA-2 lesion is 80% stenosed.   Dist RCA-1 lesion is 60% stenosed.   RPDA-1 lesion is 100% stenosed.   A stent was successfully placed.   A stent was successfully placed.   Post intervention, there is a 0% residual stenosis.   Post intervention, there is a 0% residual stenosis.   Post intervention, there is a 0% residual stenosis.   Post intervention, there is a 0% residual stenosis.   1.  High-grade distal right coronary artery and RPLV disease that was highly calcified.  He was treated with 2 overlapping drug-eluting stents with OCT guidance and orbital atherectomy. 2.  Patent proximal LAD stent with mild disease elsewhere 3.  LVEDP of 9 mmHg   Recommendation: Dual antiplatelet therapy with aspirin and Plavix for at least 6 months.  Diagnostic Dominance: Right  Intervention   _____________   History of Present Illness     Sue Mcalexander is a 71 y.o. male with past medical history of CAD status post PCI, hypertension, hyperlipidemia who is followed by Dr. Agustin Cree as an outpatient.  He had remote PCI in 2001 at [facility].  Developed recurrent in-stent restenosis and intervention done at [facility] per his report.  He was seen back in earlier in the month and complained of squeezing chest pressure/burning.  He underwent outpatient nuclear stress test which was abnormal showing ischemia involving  the inferior wall.  He was brought back to the office to discuss results and recommendations to undergo outpatient cardiac catheterization.  Cardiac catheterization was arranged for further evaluation.  Hospital Course     The patient underwent cardiac cath as noted above with high-grade distal RCA and RPL V disease which was heavily calcified.  He was treated with 2 overlapping DES with OTC guidance and orbital atherectomy.  Patent prior proximal LAD stent with mild disease in the proximal LAD. Plan for DAPT with ASA/Plavix for at least 6 months. The patient was seen by cardiac rehab while in short stay. There were no observed complications post cath. Radial cath site was re-evaluated prior to discharge and found to be stable without any complications. Instructions/precautions regarding cath site care were given prior to discharge.  Jeiden Daughtridge was seen by Dr. Ali Lowe and determined stable for discharge home. Follow up with our office has been arranged. Medications are listed below. Pertinent changes include addition of Plavix and ASA.  _____________  Cath/PCI Registry Performance & Quality Measures: Aspirin prescribed? - Yes ADP Receptor Inhibitor (Plavix/Clopidogrel, Brilinta/Ticagrelor or Effient/Prasugrel) prescribed (includes medically managed patients)? - Yes High Intensity Statin (Lipitor 40-'80mg'$  or Crestor 20-'40mg'$ ) prescribed? - Yes For EF <40%, was ACEI/ARB prescribed? - Not Applicable (EF >/= 77%) For EF <40%, Aldosterone Antagonist (Spironolactone or Eplerenone) prescribed? - Not Applicable (EF >/= 82%) Cardiac Rehab Phase II ordered (Included Medically managed Patients)? - Yes  _____________   Discharge Vitals Blood pressure 122/62, pulse 60, temperature 97.8 F (36.6 C), temperature source Temporal,  resp. rate 17, height 6' (1.829 m), weight 80.7 kg, SpO2 98 %.  Filed Weights   11/05/21 0625  Weight: 80.7 kg    Last Labs & Radiologic Studies    CBC Recent Labs     11/03/21 1541  WBC 5.9  HGB 13.5  HCT 40.7  MCV 86  PLT 676   Basic Metabolic Panel Recent Labs    11/03/21 1541  NA 143  K 4.1  CL 107*  CO2 21  GLUCOSE 115*  BUN 30*  CREATININE 1.08  CALCIUM 9.3   Liver Function Tests No results for input(s): "AST", "ALT", "ALKPHOS", "BILITOT", "PROT", "ALBUMIN" in the last 72 hours. No results for input(s): "LIPASE", "AMYLASE" in the last 72 hours. High Sensitivity Troponin:   No results for input(s): "TROPONINIHS" in the last 720 hours.  BNP Invalid input(s): "POCBNP" D-Dimer No results for input(s): "DDIMER" in the last 72 hours. Hemoglobin A1C No results for input(s): "HGBA1C" in the last 72 hours. Fasting Lipid Panel No results for input(s): "CHOL", "HDL", "LDLCALC", "TRIG", "CHOLHDL", "LDLDIRECT" in the last 72 hours. Thyroid Function Tests No results for input(s): "TSH", "T4TOTAL", "T3FREE", "THYROIDAB" in the last 72 hours.  Invalid input(s): "FREET3" _____________  CARDIAC CATHETERIZATION  Result Date: 11/05/2021   Prox LAD lesion is 15% stenosed.   Dist RCA-2 lesion is 80% stenosed.   RPAV lesion is 60% stenosed.   RPDA-2 lesion is 80% stenosed.   Dist RCA-1 lesion is 60% stenosed.   RPDA-1 lesion is 100% stenosed.   A stent was successfully placed.   A stent was successfully placed.   Post intervention, there is a 0% residual stenosis.   Post intervention, there is a 0% residual stenosis.   Post intervention, there is a 0% residual stenosis.   Post intervention, there is a 0% residual stenosis. 1.  High-grade distal right coronary artery and RPLV disease that was highly calcified.  He was treated with 2 overlapping drug-eluting stents with OCT guidance and orbital atherectomy. 2.  Patent proximal LAD stent with mild disease elsewhere 3.  LVEDP of 9 mmHg Recommendation: Dual antiplatelet therapy with aspirin and Plavix for at least 6 months.  MYOCARDIAL PERFUSION IMAGING  Result Date: 10/27/2021   Findings are consistent with  moderate ischemia involving basal and mid portion of the inferior wall . The study is intermediate risk.   horizontal ST depression (V4, V5 and V6) was noted.   Left ventricular function is normal. Nuclear stress EF: 55 %. The left ventricular ejection fraction is normal (55-65%). End diastolic cavity size is normal.   Prior study available for comparison from 02/22/2008.    Disposition   Pt is being discharged home today in good condition.  Follow-up Plans & Appointments     Follow-up Information     Park Liter, MD Follow up on 11/05/2021.   Specialty: Cardiology Why: Office will call you with an appt Contact information: [place] [address] [phone]                Discharge Instructions     AMB Referral to Cardiac Rehabilitation - Phase II   Complete by: As directed    Diagnosis: Coronary Stents   After initial evaluation and assessments completed: Virtual Based Care may be provided alone or in conjunction with Phase 2 Cardiac Rehab based on patient barriers.: Yes        Discharge Medications   Allergies as of 11/05/2021   No Known Allergies  Medication List     TAKE these medications    Ascorbic Acid 500 MG Chew Chew 500 mg by mouth daily.   Aspirin Low Dose 81 MG tablet Generic drug: aspirin EC Take 1 tablet (81 mg total) by mouth daily. Swallow whole.   Cholecalciferol 25 MCG (1000 UT) capsule Take 1,000 Units by mouth daily.   clopidogrel 75 MG tablet Commonly known as: Plavix Take 1 tablet (75 mg total) by mouth daily.   ezetimibe 10 MG tablet Commonly known as: ZETIA Take 10 mg by mouth daily.   Fish Oil 1000 MG Caps Take 1 capsule by mouth daily.   folic acid 325 MCG tablet Commonly known as: FOLVITE Take 800 mcg by mouth daily.   levothyroxine 75 MCG tablet Commonly known as: SYNTHROID Take 75 mcg by mouth daily.   melatonin 5 MG Tabs Take 5 mg by mouth at bedtime as needed for sleep.    Multi-Vitamin tablet Take 2 tablets by mouth daily.   niacin 500 MG tablet Commonly known as: VITAMIN B3 Take 500 mg by mouth daily.   nitroGLYCERIN 0.4 MG SL tablet Commonly known as: Nitrostat Place 1 tablet (0.4 mg total) under the tongue every 5 (five) minutes as needed.   rosuvastatin 20 MG tablet Commonly known as: CRESTOR Take 1 tablet (20 mg total) by mouth daily.   tamsulosin 0.4 MG Caps capsule Commonly known as: FLOMAX Take 0.4 mg by mouth daily.           Allergies No Known Allergies  Outstanding Labs/Studies   N/a   Duration of Discharge Encounter   Greater than 30 minutes including physician time.  Signed, Reino Bellis, NP 11/05/2021, 1:38 PM   ATTENDING ATTESTATION:  After conducting a review of all available clinical information with the care team, interviewing the patient, and performing a physical exam, I agree with the findings and plan described in this note.   GEN: No acute distress.   HEENT:  MMM, no JVD, no scleral icterus Cardiac: RRR, no murmurs, rubs, or gallops.  Respiratory: Clear to auscultation bilaterally. GI: Soft, nontender, non-distended  MS: No edema; No deformity. Neuro:  Nonfocal  Vasc:  +2 radial pulses  Patient is doing well after OCT guided atherectomy of distal right coronary artery into R PLV branch.  The patient's TR band is being weaned.  Anticipate discharge later today and follow-up with Dr. Agustin Cree.  Lenna Sciara, MD Pager [phone]

## 2021-11-05 NOTE — Interval H&P Note (Signed)
History and Physical Interval Note:  11/05/2021 7:14 AM  Dylan Mcdowell  has presented today for surgery, with the diagnosis of abnormal stress test - ischemia.  The various methods of treatment have been discussed with the patient and family. After consideration of risks, benefits and other options for treatment, the patient has consented to  Procedure(s): LEFT HEART CATH AND CORONARY ANGIOGRAPHY (N/A) as a surgical intervention.  The patient's history has been reviewed, patient examined, no change in status, stable for surgery.  I have reviewed the patient's chart and labs.  Questions were answered to the patient's satisfaction.    Cath Lab Visit (complete for each Cath Lab visit)  Clinical Evaluation Leading to the Procedure:   ACS: No.  Non-ACS:    Anginal Classification: CCS I  Anti-ischemic medical therapy: No Therapy  Non-Invasive Test Results: Intermediate-risk stress test findings: cardiac mortality 1-3%/year  Prior CABG: No previous CABG        [place]

## 2021-11-06 ENCOUNTER — Encounter (HOSPITAL_COMMUNITY): Payer: Self-pay | Admitting: Internal Medicine

## 2021-11-09 ENCOUNTER — Encounter (HOSPITAL_COMMUNITY): Payer: Self-pay | Admitting: Internal Medicine

## 2021-11-10 ENCOUNTER — Telehealth: Payer: Self-pay | Admitting: Cardiology

## 2021-11-10 NOTE — Telephone Encounter (Signed)
  Pt is requesting to speak with RN Dede

## 2021-11-10 NOTE — Telephone Encounter (Signed)
Spoke with pt who recently had a cardiac cath. He was making sure nitroglycerin was a PRN medication, He has a small knot on his wrist. Advised to watch area, if it gets larger, red, inflamed or painful advised to come in for area to be checked. He also wanted to clarify it was ok to do light work around the house within limits of the after procedure instructions. Encouraged pt to call with any questions or concerns. He had no further questions.

## 2021-11-12 ENCOUNTER — Telehealth (HOSPITAL_COMMUNITY): Payer: Self-pay

## 2021-11-12 NOTE — Telephone Encounter (Signed)
Per phase I cardiac rehab, fax referral to [facility]. 

## 2021-11-17 DIAGNOSIS — E785 Hyperlipidemia, unspecified: Secondary | ICD-10-CM | POA: Diagnosis not present

## 2021-11-17 DIAGNOSIS — Z955 Presence of coronary angioplasty implant and graft: Secondary | ICD-10-CM | POA: Diagnosis not present

## 2021-11-17 DIAGNOSIS — I2510 Atherosclerotic heart disease of native coronary artery without angina pectoris: Secondary | ICD-10-CM | POA: Diagnosis not present

## 2021-11-17 DIAGNOSIS — I10 Essential (primary) hypertension: Secondary | ICD-10-CM | POA: Diagnosis not present

## 2021-11-18 DIAGNOSIS — E785 Hyperlipidemia, unspecified: Secondary | ICD-10-CM | POA: Diagnosis not present

## 2021-11-18 DIAGNOSIS — I10 Essential (primary) hypertension: Secondary | ICD-10-CM | POA: Diagnosis not present

## 2021-11-18 DIAGNOSIS — I2510 Atherosclerotic heart disease of native coronary artery without angina pectoris: Secondary | ICD-10-CM | POA: Diagnosis not present

## 2021-11-18 DIAGNOSIS — Z955 Presence of coronary angioplasty implant and graft: Secondary | ICD-10-CM | POA: Diagnosis not present

## 2021-11-20 DIAGNOSIS — Z955 Presence of coronary angioplasty implant and graft: Secondary | ICD-10-CM | POA: Diagnosis not present

## 2021-11-20 DIAGNOSIS — I10 Essential (primary) hypertension: Secondary | ICD-10-CM | POA: Diagnosis not present

## 2021-11-20 DIAGNOSIS — E785 Hyperlipidemia, unspecified: Secondary | ICD-10-CM | POA: Diagnosis not present

## 2021-11-20 DIAGNOSIS — I2510 Atherosclerotic heart disease of native coronary artery without angina pectoris: Secondary | ICD-10-CM | POA: Diagnosis not present

## 2021-11-24 DIAGNOSIS — I2510 Atherosclerotic heart disease of native coronary artery without angina pectoris: Secondary | ICD-10-CM | POA: Diagnosis not present

## 2021-11-24 DIAGNOSIS — E785 Hyperlipidemia, unspecified: Secondary | ICD-10-CM | POA: Diagnosis not present

## 2021-11-24 DIAGNOSIS — I10 Essential (primary) hypertension: Secondary | ICD-10-CM | POA: Diagnosis not present

## 2021-11-24 DIAGNOSIS — Z955 Presence of coronary angioplasty implant and graft: Secondary | ICD-10-CM | POA: Diagnosis not present

## 2021-11-25 DIAGNOSIS — E785 Hyperlipidemia, unspecified: Secondary | ICD-10-CM | POA: Diagnosis not present

## 2021-11-25 DIAGNOSIS — I10 Essential (primary) hypertension: Secondary | ICD-10-CM | POA: Diagnosis not present

## 2021-11-25 DIAGNOSIS — I2510 Atherosclerotic heart disease of native coronary artery without angina pectoris: Secondary | ICD-10-CM | POA: Diagnosis not present

## 2021-11-25 DIAGNOSIS — Z955 Presence of coronary angioplasty implant and graft: Secondary | ICD-10-CM | POA: Diagnosis not present

## 2021-11-27 DIAGNOSIS — E785 Hyperlipidemia, unspecified: Secondary | ICD-10-CM | POA: Diagnosis not present

## 2021-11-27 DIAGNOSIS — I2510 Atherosclerotic heart disease of native coronary artery without angina pectoris: Secondary | ICD-10-CM | POA: Diagnosis not present

## 2021-11-27 DIAGNOSIS — I10 Essential (primary) hypertension: Secondary | ICD-10-CM | POA: Diagnosis not present

## 2021-11-27 DIAGNOSIS — Z955 Presence of coronary angioplasty implant and graft: Secondary | ICD-10-CM | POA: Diagnosis not present

## 2021-11-30 DIAGNOSIS — Z955 Presence of coronary angioplasty implant and graft: Secondary | ICD-10-CM | POA: Diagnosis not present

## 2021-11-30 DIAGNOSIS — E785 Hyperlipidemia, unspecified: Secondary | ICD-10-CM | POA: Diagnosis not present

## 2021-11-30 DIAGNOSIS — I10 Essential (primary) hypertension: Secondary | ICD-10-CM | POA: Diagnosis not present

## 2021-11-30 DIAGNOSIS — I2510 Atherosclerotic heart disease of native coronary artery without angina pectoris: Secondary | ICD-10-CM | POA: Diagnosis not present

## 2021-12-02 DIAGNOSIS — Z955 Presence of coronary angioplasty implant and graft: Secondary | ICD-10-CM | POA: Diagnosis not present

## 2021-12-02 DIAGNOSIS — I10 Essential (primary) hypertension: Secondary | ICD-10-CM | POA: Diagnosis not present

## 2021-12-02 DIAGNOSIS — I2510 Atherosclerotic heart disease of native coronary artery without angina pectoris: Secondary | ICD-10-CM | POA: Diagnosis not present

## 2021-12-02 DIAGNOSIS — E785 Hyperlipidemia, unspecified: Secondary | ICD-10-CM | POA: Diagnosis not present

## 2021-12-03 ENCOUNTER — Ambulatory Visit: Payer: Medicare Other | Admitting: Cardiology

## 2021-12-04 DIAGNOSIS — E785 Hyperlipidemia, unspecified: Secondary | ICD-10-CM | POA: Diagnosis not present

## 2021-12-04 DIAGNOSIS — Z955 Presence of coronary angioplasty implant and graft: Secondary | ICD-10-CM | POA: Diagnosis not present

## 2021-12-04 DIAGNOSIS — I2510 Atherosclerotic heart disease of native coronary artery without angina pectoris: Secondary | ICD-10-CM | POA: Diagnosis not present

## 2021-12-04 DIAGNOSIS — I10 Essential (primary) hypertension: Secondary | ICD-10-CM | POA: Diagnosis not present

## 2021-12-07 DIAGNOSIS — I2510 Atherosclerotic heart disease of native coronary artery without angina pectoris: Secondary | ICD-10-CM | POA: Diagnosis not present

## 2021-12-07 DIAGNOSIS — Z955 Presence of coronary angioplasty implant and graft: Secondary | ICD-10-CM | POA: Diagnosis not present

## 2021-12-07 DIAGNOSIS — E785 Hyperlipidemia, unspecified: Secondary | ICD-10-CM | POA: Diagnosis not present

## 2021-12-07 DIAGNOSIS — I10 Essential (primary) hypertension: Secondary | ICD-10-CM | POA: Diagnosis not present

## 2021-12-09 ENCOUNTER — Telehealth: Payer: Self-pay | Admitting: Cardiology

## 2021-12-09 DIAGNOSIS — E785 Hyperlipidemia, unspecified: Secondary | ICD-10-CM | POA: Diagnosis not present

## 2021-12-09 DIAGNOSIS — I10 Essential (primary) hypertension: Secondary | ICD-10-CM | POA: Diagnosis not present

## 2021-12-09 DIAGNOSIS — S80869A Insect bite (nonvenomous), unspecified lower leg, initial encounter: Secondary | ICD-10-CM | POA: Diagnosis not present

## 2021-12-09 DIAGNOSIS — I2510 Atherosclerotic heart disease of native coronary artery without angina pectoris: Secondary | ICD-10-CM | POA: Diagnosis not present

## 2021-12-09 DIAGNOSIS — Z955 Presence of coronary angioplasty implant and graft: Secondary | ICD-10-CM | POA: Diagnosis not present

## 2021-12-09 MED ORDER — CLOPIDOGREL BISULFATE 75 MG PO TABS: 75.0000 mg | ORAL_TABLET | Freq: Every day | ORAL | 3 refills | Status: DC

## 2021-12-09 NOTE — Telephone Encounter (Signed)
 *  STAT* If patient is at the pharmacy, call can be transferred to refill team.   1. Which medications need to be refilled? (please list name of each medication and dose if known) clopidogrel (PLAVIX) 75 MG tablet  2. Which pharmacy/location (including street and city if local pharmacy) is medication to be sent to? [facility], [place] [address]  3. Do they need a 30 day or 90 day supply? 90 days  Pt is out of meds

## 2021-12-11 DIAGNOSIS — Z955 Presence of coronary angioplasty implant and graft: Secondary | ICD-10-CM | POA: Diagnosis not present

## 2021-12-11 DIAGNOSIS — I2510 Atherosclerotic heart disease of native coronary artery without angina pectoris: Secondary | ICD-10-CM | POA: Diagnosis not present

## 2021-12-11 DIAGNOSIS — I10 Essential (primary) hypertension: Secondary | ICD-10-CM | POA: Diagnosis not present

## 2021-12-11 DIAGNOSIS — E785 Hyperlipidemia, unspecified: Secondary | ICD-10-CM | POA: Diagnosis not present

## 2021-12-14 DIAGNOSIS — Z955 Presence of coronary angioplasty implant and graft: Secondary | ICD-10-CM | POA: Diagnosis not present

## 2021-12-14 DIAGNOSIS — E785 Hyperlipidemia, unspecified: Secondary | ICD-10-CM | POA: Diagnosis not present

## 2021-12-14 DIAGNOSIS — I2510 Atherosclerotic heart disease of native coronary artery without angina pectoris: Secondary | ICD-10-CM | POA: Diagnosis not present

## 2021-12-14 DIAGNOSIS — I10 Essential (primary) hypertension: Secondary | ICD-10-CM | POA: Diagnosis not present

## 2021-12-16 DIAGNOSIS — I2510 Atherosclerotic heart disease of native coronary artery without angina pectoris: Secondary | ICD-10-CM | POA: Diagnosis not present

## 2021-12-16 DIAGNOSIS — Z955 Presence of coronary angioplasty implant and graft: Secondary | ICD-10-CM | POA: Diagnosis not present

## 2021-12-16 DIAGNOSIS — E785 Hyperlipidemia, unspecified: Secondary | ICD-10-CM | POA: Diagnosis not present

## 2021-12-16 DIAGNOSIS — I10 Essential (primary) hypertension: Secondary | ICD-10-CM | POA: Diagnosis not present

## 2021-12-18 DIAGNOSIS — Z23 Encounter for immunization: Secondary | ICD-10-CM | POA: Diagnosis not present

## 2021-12-18 DIAGNOSIS — I2510 Atherosclerotic heart disease of native coronary artery without angina pectoris: Secondary | ICD-10-CM | POA: Diagnosis not present

## 2021-12-18 DIAGNOSIS — I10 Essential (primary) hypertension: Secondary | ICD-10-CM | POA: Diagnosis not present

## 2021-12-18 DIAGNOSIS — E039 Hypothyroidism, unspecified: Secondary | ICD-10-CM | POA: Diagnosis not present

## 2021-12-18 DIAGNOSIS — E785 Hyperlipidemia, unspecified: Secondary | ICD-10-CM | POA: Diagnosis not present

## 2021-12-18 DIAGNOSIS — Z955 Presence of coronary angioplasty implant and graft: Secondary | ICD-10-CM | POA: Diagnosis not present

## 2021-12-18 DIAGNOSIS — G3184 Mild cognitive impairment, so stated: Secondary | ICD-10-CM | POA: Diagnosis not present

## 2021-12-18 DIAGNOSIS — Z6824 Body mass index (BMI) 24.0-24.9, adult: Secondary | ICD-10-CM | POA: Diagnosis not present

## 2021-12-18 DIAGNOSIS — E7800 Pure hypercholesterolemia, unspecified: Secondary | ICD-10-CM | POA: Diagnosis not present

## 2021-12-21 DIAGNOSIS — E785 Hyperlipidemia, unspecified: Secondary | ICD-10-CM | POA: Diagnosis not present

## 2021-12-21 DIAGNOSIS — Z955 Presence of coronary angioplasty implant and graft: Secondary | ICD-10-CM | POA: Diagnosis not present

## 2021-12-21 DIAGNOSIS — I2510 Atherosclerotic heart disease of native coronary artery without angina pectoris: Secondary | ICD-10-CM | POA: Diagnosis not present

## 2021-12-21 DIAGNOSIS — I10 Essential (primary) hypertension: Secondary | ICD-10-CM | POA: Diagnosis not present

## 2021-12-23 ENCOUNTER — Telehealth: Payer: Self-pay | Admitting: Cardiology

## 2021-12-23 DIAGNOSIS — I2510 Atherosclerotic heart disease of native coronary artery without angina pectoris: Secondary | ICD-10-CM | POA: Diagnosis not present

## 2021-12-23 DIAGNOSIS — I10 Essential (primary) hypertension: Secondary | ICD-10-CM | POA: Diagnosis not present

## 2021-12-23 DIAGNOSIS — Z955 Presence of coronary angioplasty implant and graft: Secondary | ICD-10-CM | POA: Diagnosis not present

## 2021-12-23 DIAGNOSIS — E785 Hyperlipidemia, unspecified: Secondary | ICD-10-CM | POA: Diagnosis not present

## 2021-12-23 NOTE — Telephone Encounter (Signed)
   Primary Cardiologist: Jenne Campus, MD  Chart reviewed as part of pre-operative protocol coverage. Simple dental extractions are considered low risk procedures per guidelines and generally do not require any specific cardiac clearance. It is also generally accepted that for simple extractions and dental cleanings, there is no need to interrupt blood thinner therapy.   SBE prophylaxis is not required for the patient. He is NOT cleared to hold antiplatelet medications pending recent coronary intervention.   I will route this recommendation to the requesting party via Epic fax function and remove from pre-op pool.  Please call with questions.  Emmaline Life, NP-C  12/23/2021, 10:15 AM [address], [address] Office [phone] Fax [phone]

## 2021-12-23 NOTE — Telephone Encounter (Signed)
   Pre-operative Risk Assessment    Patient Name: Dylan Mcdowell  DOB: Mar 22, 1951 MRN: [id number]     Request for Surgical Clearance    Procedure:   Cleaning  Date of Surgery:  Clearance 12/23/21                                 Surgeon:  Dr. Rosana Berger Surgeon's Group or Practice Name:  [facility] Phone number:  [phone] Fax number:     Type of Clearance Requested:   - Medical    Type of Anesthesia:  None    Additional requests/questions:  Please advise surgeon/provider what medications should be held.  Signed, Belisicia T Harris   12/23/2021, 9:59 AM

## 2021-12-24 DIAGNOSIS — K091 Developmental (nonodontogenic) cysts of oral region: Secondary | ICD-10-CM | POA: Diagnosis not present

## 2021-12-25 DIAGNOSIS — E785 Hyperlipidemia, unspecified: Secondary | ICD-10-CM | POA: Diagnosis not present

## 2021-12-25 DIAGNOSIS — I10 Essential (primary) hypertension: Secondary | ICD-10-CM | POA: Diagnosis not present

## 2021-12-25 DIAGNOSIS — Z955 Presence of coronary angioplasty implant and graft: Secondary | ICD-10-CM | POA: Diagnosis not present

## 2021-12-25 DIAGNOSIS — I2510 Atherosclerotic heart disease of native coronary artery without angina pectoris: Secondary | ICD-10-CM | POA: Diagnosis not present

## 2021-12-27 DIAGNOSIS — Z23 Encounter for immunization: Secondary | ICD-10-CM | POA: Diagnosis not present

## 2021-12-28 DIAGNOSIS — I10 Essential (primary) hypertension: Secondary | ICD-10-CM | POA: Diagnosis not present

## 2021-12-28 DIAGNOSIS — Z955 Presence of coronary angioplasty implant and graft: Secondary | ICD-10-CM | POA: Diagnosis not present

## 2021-12-28 DIAGNOSIS — I2510 Atherosclerotic heart disease of native coronary artery without angina pectoris: Secondary | ICD-10-CM | POA: Diagnosis not present

## 2021-12-28 DIAGNOSIS — E785 Hyperlipidemia, unspecified: Secondary | ICD-10-CM | POA: Diagnosis not present

## 2021-12-30 DIAGNOSIS — I2510 Atherosclerotic heart disease of native coronary artery without angina pectoris: Secondary | ICD-10-CM | POA: Diagnosis not present

## 2021-12-30 DIAGNOSIS — E785 Hyperlipidemia, unspecified: Secondary | ICD-10-CM | POA: Diagnosis not present

## 2021-12-30 DIAGNOSIS — I10 Essential (primary) hypertension: Secondary | ICD-10-CM | POA: Diagnosis not present

## 2021-12-30 DIAGNOSIS — Z955 Presence of coronary angioplasty implant and graft: Secondary | ICD-10-CM | POA: Diagnosis not present

## 2021-12-31 ENCOUNTER — Other Ambulatory Visit (HOSPITAL_COMMUNITY): Payer: Self-pay

## 2022-01-01 DIAGNOSIS — Z955 Presence of coronary angioplasty implant and graft: Secondary | ICD-10-CM | POA: Diagnosis not present

## 2022-01-01 DIAGNOSIS — I10 Essential (primary) hypertension: Secondary | ICD-10-CM | POA: Diagnosis not present

## 2022-01-01 DIAGNOSIS — I2510 Atherosclerotic heart disease of native coronary artery without angina pectoris: Secondary | ICD-10-CM | POA: Diagnosis not present

## 2022-01-01 DIAGNOSIS — E785 Hyperlipidemia, unspecified: Secondary | ICD-10-CM | POA: Diagnosis not present

## 2022-01-04 DIAGNOSIS — Z955 Presence of coronary angioplasty implant and graft: Secondary | ICD-10-CM | POA: Diagnosis not present

## 2022-01-04 DIAGNOSIS — E785 Hyperlipidemia, unspecified: Secondary | ICD-10-CM | POA: Diagnosis not present

## 2022-01-04 DIAGNOSIS — I10 Essential (primary) hypertension: Secondary | ICD-10-CM | POA: Diagnosis not present

## 2022-01-04 DIAGNOSIS — I2510 Atherosclerotic heart disease of native coronary artery without angina pectoris: Secondary | ICD-10-CM | POA: Diagnosis not present

## 2022-01-06 DIAGNOSIS — E785 Hyperlipidemia, unspecified: Secondary | ICD-10-CM | POA: Diagnosis not present

## 2022-01-06 DIAGNOSIS — I2510 Atherosclerotic heart disease of native coronary artery without angina pectoris: Secondary | ICD-10-CM | POA: Diagnosis not present

## 2022-01-06 DIAGNOSIS — I10 Essential (primary) hypertension: Secondary | ICD-10-CM | POA: Diagnosis not present

## 2022-01-06 DIAGNOSIS — Z955 Presence of coronary angioplasty implant and graft: Secondary | ICD-10-CM | POA: Diagnosis not present

## 2022-01-07 IMAGING — MR MR CERVICAL SPINE W/O CM
6 series · 33 of 48 positions shown · non-contrast
Comparison: None.

CLINICAL DATA: Upper arm and bilateral shoulder pain for 2 months

EXAM:
MRI CERVICAL SPINE WITHOUT CONTRAST
TECHNIQUE: Multiplanar, multisequence MR imaging of the cervical spine was
performed. No intravenous contrast was administered.

[Series 6: T1 · sagittal · 3.0mm · 0.66mm/px · 4 of 19 slices shown]
[im 1/19]
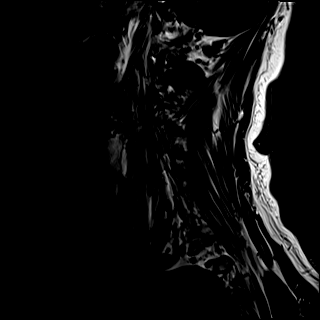
[im 7/19]
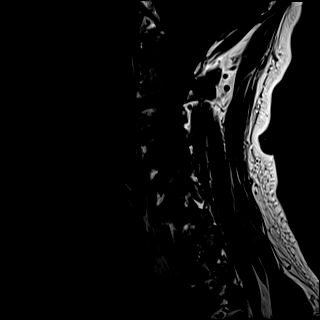
[im 13/19]
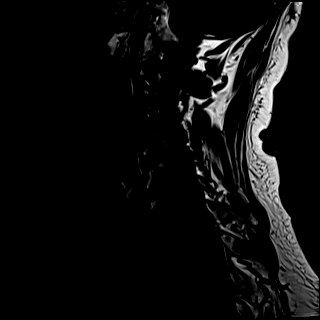
[im 19/19]
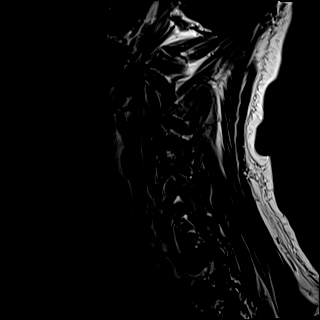

[Series 7: T2 · sagittal · 3.0mm · 0.55mm/px · 4 of 19 slices shown (1 of 3)]
[im 1/19]
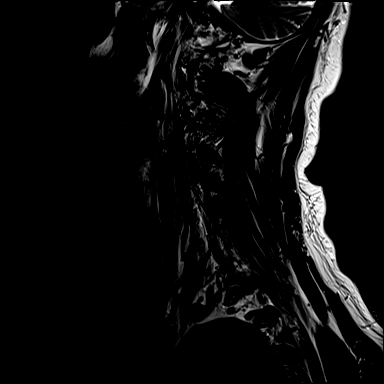
[im 7/19]
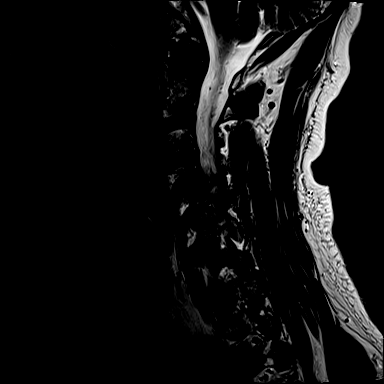
[im 13/19]
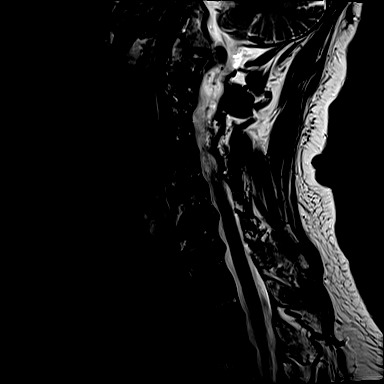
[im 19/19]
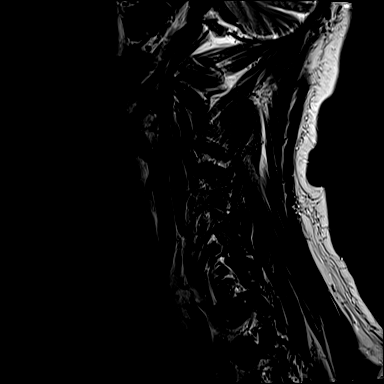

[Series 8: STIR · sagittal · 3.0mm · 0.33mm/px · 4 of 19 slices shown]
[im 1/19]
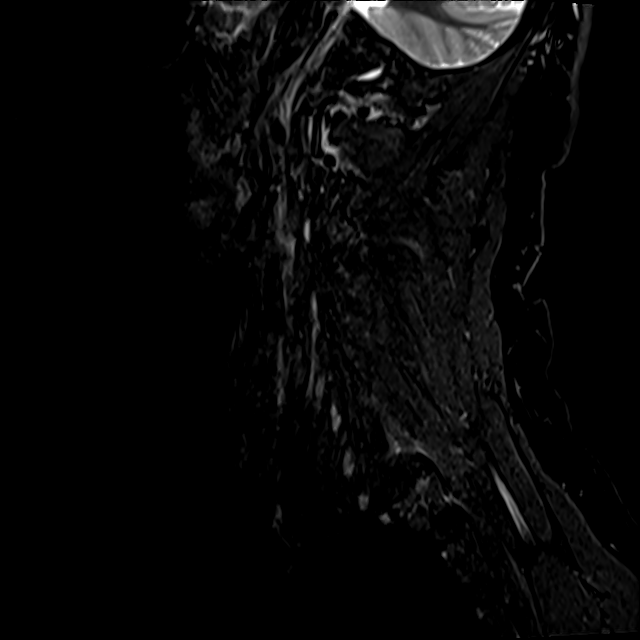
[im 7/19]
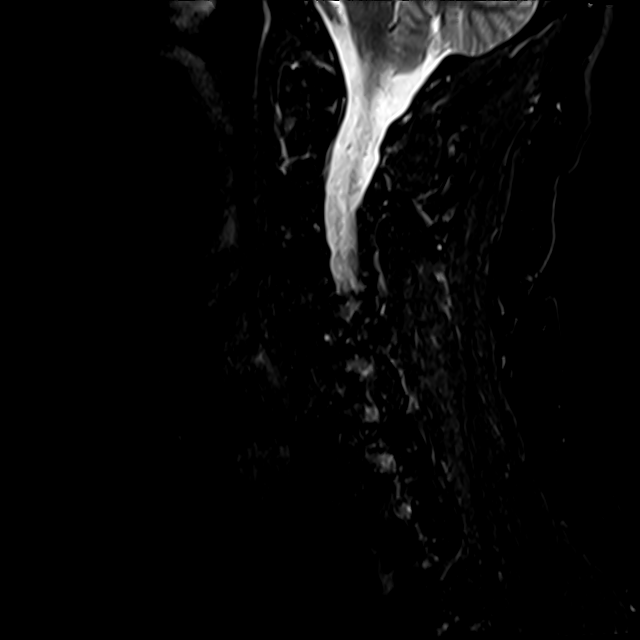
[im 13/19]
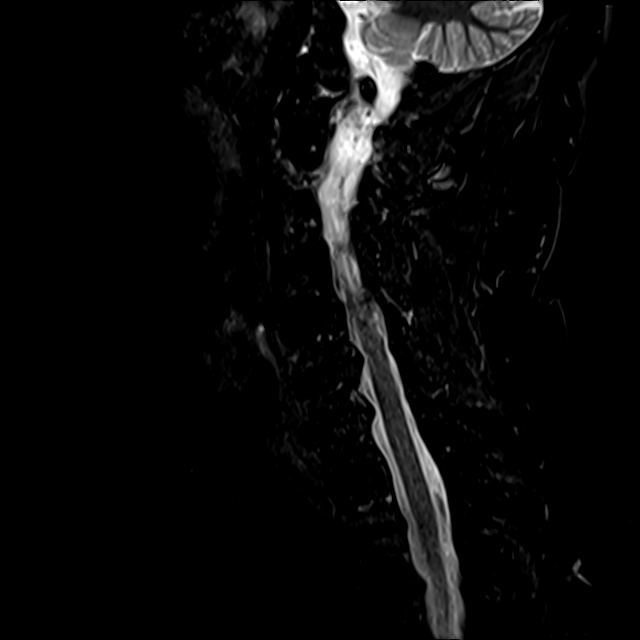
[im 19/19]
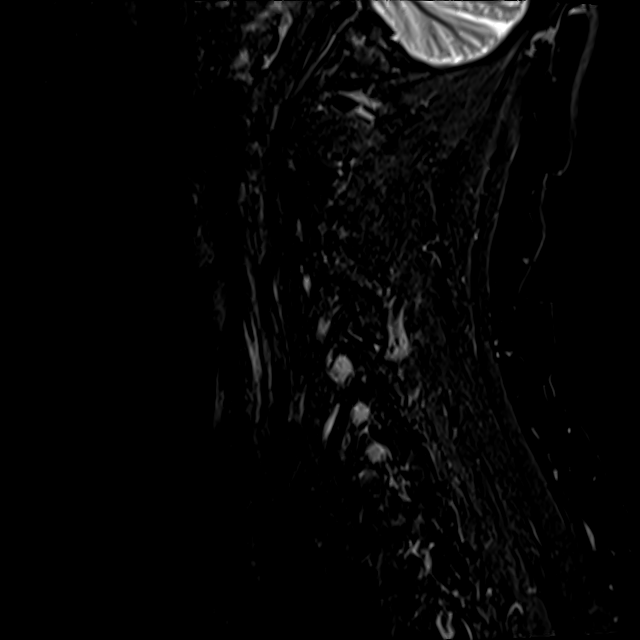

[Series 9: T2 · axial · 3.0mm · 0.50mm/px · z∈[-175,-11]mm · 9 of 52 slices shown (2 of 3)]
[im 1/52]
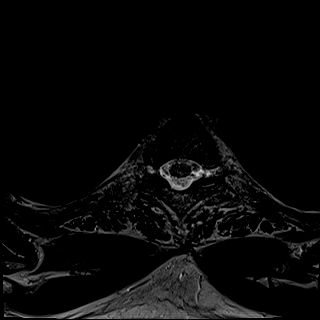
[im 10/52]
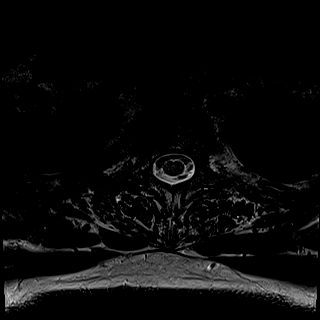
[im 14/52]
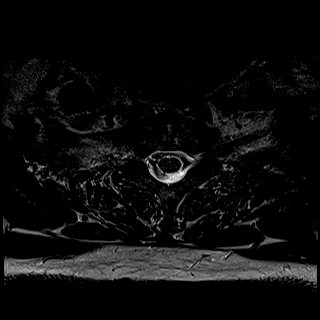
[im 24/52]
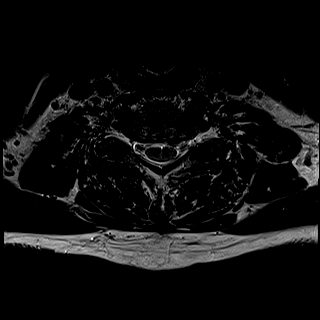
[im 28/52]
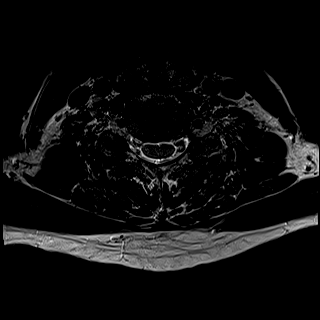
[im 38/52]
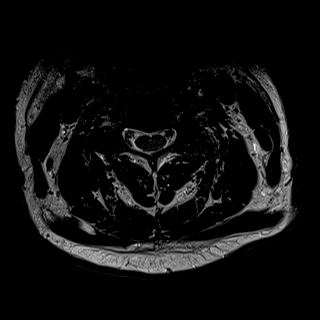
[im 42/52]
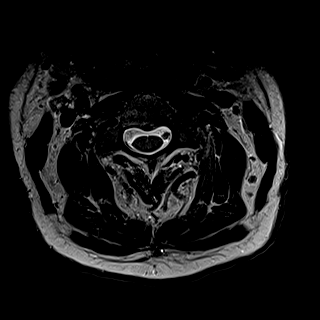
[im 47/52]
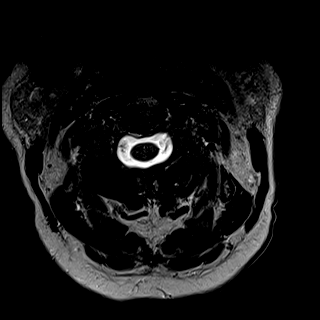
[im 52/52]
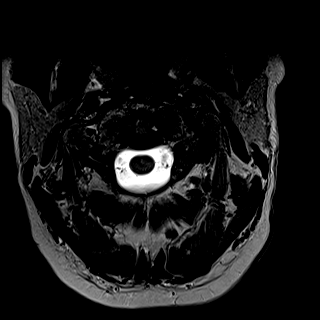

[Series 10: GRE · axial · 3.0mm · 0.42mm/px · z∈[-175,-133]mm · 3 of 52 slices shown]
[im 1/52]
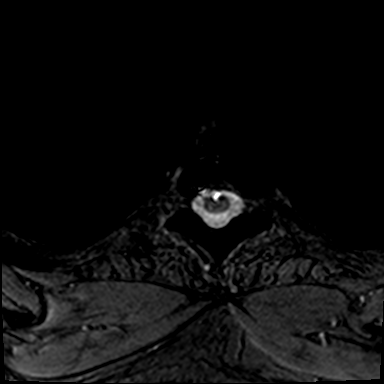
[im 10/52]
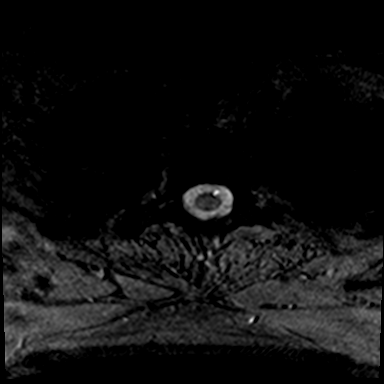
[im 14/52]
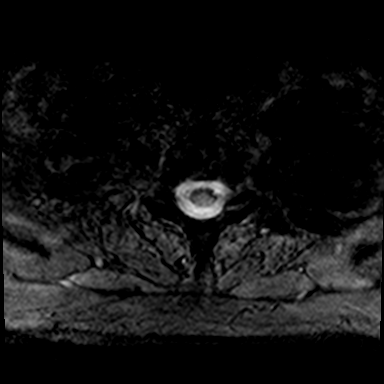

[Series 11: T2 · axial · 3.0mm · 0.50mm/px · z∈[-175,-11]mm · 9 of 52 slices shown (3 of 3)]
[im 1/52]
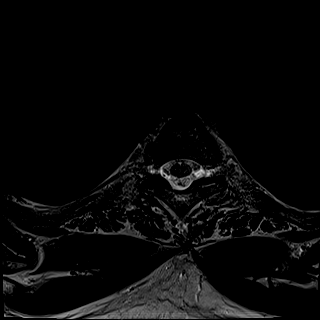
[im 10/52]
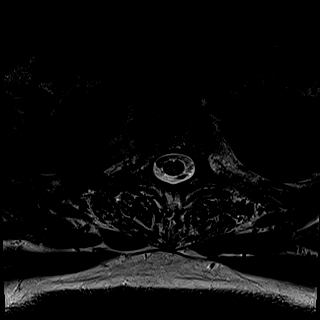
[im 14/52]
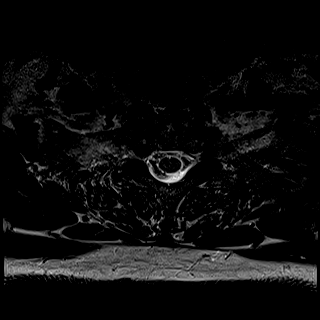
[im 24/52]
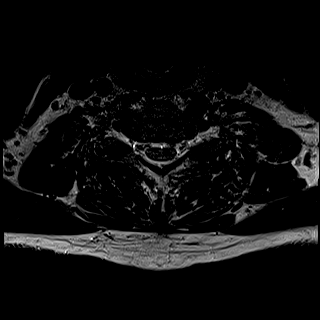
[im 28/52]
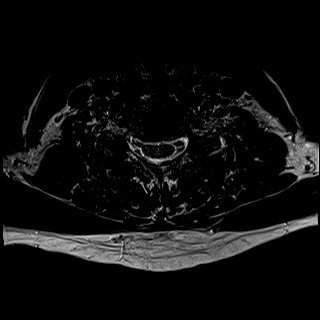
[im 38/52]
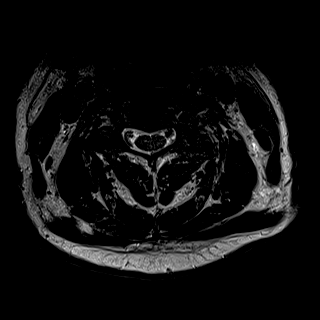
[im 42/52]
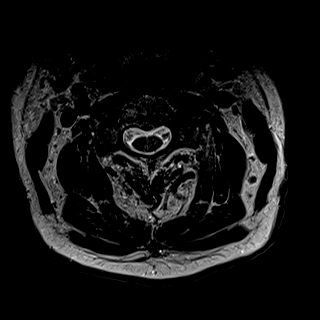
[im 47/52]
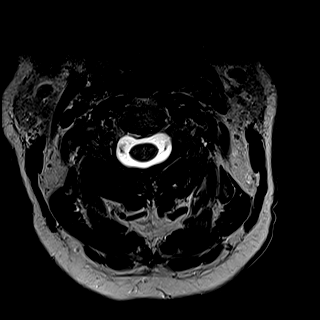
[im 52/52]
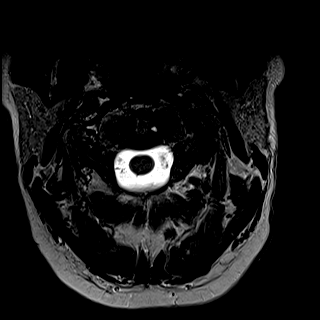

[33 of 48 positions shown; findings below may reference images not displayed]

FINDINGS: Alignment: Mild C4-5 anterolisthesis.

Vertebrae: No fracture, evidence of discitis, or bone lesion.

Cord: Normal signal and morphology.

Posterior Fossa, vertebral arteries, paraspinal tissues: Negative.

Disc levels:

C2-3: Shallow central disc protrusion.

C3-4: Facet osteoarthritis with bulky left-sided spurring. Mild left
foraminal narrowing

C4-5: Facet and right uncovertebral spurring. Disc space narrowing.
Mild right foraminal narrowing. Patent spinal canal

C5-6: Disc narrowing and bulging with endplate and uncovertebral
ridging. Negative facets. No neural compression

C6-7: Disc narrowing and bulging with endplate ridging. Negative
facets. Mild bilateral foraminal narrowing

C7-T1:Minimal facet spurring.  No neural impingement
IMPRESSION: 1. Generalized cervical spine degeneration as described.
2. Diffusely patent spinal canal.
3. No high-grade foraminal impingement.

## 2022-01-08 DIAGNOSIS — I10 Essential (primary) hypertension: Secondary | ICD-10-CM | POA: Diagnosis not present

## 2022-01-08 DIAGNOSIS — I2510 Atherosclerotic heart disease of native coronary artery without angina pectoris: Secondary | ICD-10-CM | POA: Diagnosis not present

## 2022-01-08 DIAGNOSIS — E785 Hyperlipidemia, unspecified: Secondary | ICD-10-CM | POA: Diagnosis not present

## 2022-01-08 DIAGNOSIS — Z955 Presence of coronary angioplasty implant and graft: Secondary | ICD-10-CM | POA: Diagnosis not present

## 2022-01-11 DIAGNOSIS — Z955 Presence of coronary angioplasty implant and graft: Secondary | ICD-10-CM | POA: Diagnosis not present

## 2022-01-11 DIAGNOSIS — I10 Essential (primary) hypertension: Secondary | ICD-10-CM | POA: Diagnosis not present

## 2022-01-11 DIAGNOSIS — I2510 Atherosclerotic heart disease of native coronary artery without angina pectoris: Secondary | ICD-10-CM | POA: Diagnosis not present

## 2022-01-11 DIAGNOSIS — E785 Hyperlipidemia, unspecified: Secondary | ICD-10-CM | POA: Diagnosis not present

## 2022-01-13 DIAGNOSIS — Z955 Presence of coronary angioplasty implant and graft: Secondary | ICD-10-CM | POA: Diagnosis not present

## 2022-01-13 DIAGNOSIS — E785 Hyperlipidemia, unspecified: Secondary | ICD-10-CM | POA: Diagnosis not present

## 2022-01-13 DIAGNOSIS — I10 Essential (primary) hypertension: Secondary | ICD-10-CM | POA: Diagnosis not present

## 2022-01-13 DIAGNOSIS — I2510 Atherosclerotic heart disease of native coronary artery without angina pectoris: Secondary | ICD-10-CM | POA: Diagnosis not present

## 2022-01-15 DIAGNOSIS — E785 Hyperlipidemia, unspecified: Secondary | ICD-10-CM | POA: Diagnosis not present

## 2022-01-15 DIAGNOSIS — I2510 Atherosclerotic heart disease of native coronary artery without angina pectoris: Secondary | ICD-10-CM | POA: Diagnosis not present

## 2022-01-15 DIAGNOSIS — I10 Essential (primary) hypertension: Secondary | ICD-10-CM | POA: Diagnosis not present

## 2022-01-15 DIAGNOSIS — Z955 Presence of coronary angioplasty implant and graft: Secondary | ICD-10-CM | POA: Diagnosis not present

## 2022-01-18 DIAGNOSIS — I10 Essential (primary) hypertension: Secondary | ICD-10-CM | POA: Diagnosis not present

## 2022-01-18 DIAGNOSIS — I2510 Atherosclerotic heart disease of native coronary artery without angina pectoris: Secondary | ICD-10-CM | POA: Diagnosis not present

## 2022-01-18 DIAGNOSIS — Z955 Presence of coronary angioplasty implant and graft: Secondary | ICD-10-CM | POA: Diagnosis not present

## 2022-01-18 DIAGNOSIS — E785 Hyperlipidemia, unspecified: Secondary | ICD-10-CM | POA: Diagnosis not present

## 2022-01-20 DIAGNOSIS — I2510 Atherosclerotic heart disease of native coronary artery without angina pectoris: Secondary | ICD-10-CM | POA: Diagnosis not present

## 2022-01-20 DIAGNOSIS — I10 Essential (primary) hypertension: Secondary | ICD-10-CM | POA: Diagnosis not present

## 2022-01-20 DIAGNOSIS — Z955 Presence of coronary angioplasty implant and graft: Secondary | ICD-10-CM | POA: Diagnosis not present

## 2022-01-20 DIAGNOSIS — E785 Hyperlipidemia, unspecified: Secondary | ICD-10-CM | POA: Diagnosis not present

## 2022-01-22 DIAGNOSIS — I10 Essential (primary) hypertension: Secondary | ICD-10-CM | POA: Diagnosis not present

## 2022-01-22 DIAGNOSIS — E785 Hyperlipidemia, unspecified: Secondary | ICD-10-CM | POA: Diagnosis not present

## 2022-01-22 DIAGNOSIS — Z955 Presence of coronary angioplasty implant and graft: Secondary | ICD-10-CM | POA: Diagnosis not present

## 2022-01-22 DIAGNOSIS — I2510 Atherosclerotic heart disease of native coronary artery without angina pectoris: Secondary | ICD-10-CM | POA: Diagnosis not present

## 2022-01-25 DIAGNOSIS — Z955 Presence of coronary angioplasty implant and graft: Secondary | ICD-10-CM | POA: Diagnosis not present

## 2022-01-25 DIAGNOSIS — I10 Essential (primary) hypertension: Secondary | ICD-10-CM | POA: Diagnosis not present

## 2022-01-25 DIAGNOSIS — I2510 Atherosclerotic heart disease of native coronary artery without angina pectoris: Secondary | ICD-10-CM | POA: Diagnosis not present

## 2022-01-25 DIAGNOSIS — E785 Hyperlipidemia, unspecified: Secondary | ICD-10-CM | POA: Diagnosis not present

## 2022-01-27 DIAGNOSIS — I2510 Atherosclerotic heart disease of native coronary artery without angina pectoris: Secondary | ICD-10-CM | POA: Diagnosis not present

## 2022-01-27 DIAGNOSIS — I10 Essential (primary) hypertension: Secondary | ICD-10-CM | POA: Diagnosis not present

## 2022-01-27 DIAGNOSIS — Z955 Presence of coronary angioplasty implant and graft: Secondary | ICD-10-CM | POA: Diagnosis not present

## 2022-01-27 DIAGNOSIS — E785 Hyperlipidemia, unspecified: Secondary | ICD-10-CM | POA: Diagnosis not present

## 2022-01-29 DIAGNOSIS — Z955 Presence of coronary angioplasty implant and graft: Secondary | ICD-10-CM | POA: Diagnosis not present

## 2022-01-29 DIAGNOSIS — I2510 Atherosclerotic heart disease of native coronary artery without angina pectoris: Secondary | ICD-10-CM | POA: Diagnosis not present

## 2022-01-29 DIAGNOSIS — I10 Essential (primary) hypertension: Secondary | ICD-10-CM | POA: Diagnosis not present

## 2022-01-29 DIAGNOSIS — E785 Hyperlipidemia, unspecified: Secondary | ICD-10-CM | POA: Diagnosis not present

## 2022-02-01 DIAGNOSIS — I10 Essential (primary) hypertension: Secondary | ICD-10-CM | POA: Diagnosis not present

## 2022-02-01 DIAGNOSIS — Z955 Presence of coronary angioplasty implant and graft: Secondary | ICD-10-CM | POA: Diagnosis not present

## 2022-02-01 DIAGNOSIS — E785 Hyperlipidemia, unspecified: Secondary | ICD-10-CM | POA: Diagnosis not present

## 2022-02-01 DIAGNOSIS — I2510 Atherosclerotic heart disease of native coronary artery without angina pectoris: Secondary | ICD-10-CM | POA: Diagnosis not present

## 2022-02-03 ENCOUNTER — Ambulatory Visit: Payer: Medicare Other | Attending: Cardiology | Admitting: Cardiology

## 2022-02-03 ENCOUNTER — Encounter: Payer: Self-pay | Admitting: Cardiology

## 2022-02-03 VITALS — BP 118/70 | HR 72 | Ht 72.0 in | Wt 180.4 lb

## 2022-02-03 DIAGNOSIS — E7800 Pure hypercholesterolemia, unspecified: Secondary | ICD-10-CM | POA: Insufficient documentation

## 2022-02-03 DIAGNOSIS — R9439 Abnormal result of other cardiovascular function study: Secondary | ICD-10-CM | POA: Insufficient documentation

## 2022-02-03 DIAGNOSIS — I739 Peripheral vascular disease, unspecified: Secondary | ICD-10-CM | POA: Diagnosis not present

## 2022-02-03 DIAGNOSIS — I2510 Atherosclerotic heart disease of native coronary artery without angina pectoris: Secondary | ICD-10-CM | POA: Diagnosis not present

## 2022-02-03 DIAGNOSIS — I25118 Atherosclerotic heart disease of native coronary artery with other forms of angina pectoris: Secondary | ICD-10-CM | POA: Insufficient documentation

## 2022-02-03 DIAGNOSIS — E785 Hyperlipidemia, unspecified: Secondary | ICD-10-CM | POA: Diagnosis not present

## 2022-02-03 DIAGNOSIS — I10 Essential (primary) hypertension: Secondary | ICD-10-CM | POA: Diagnosis not present

## 2022-02-03 DIAGNOSIS — Z955 Presence of coronary angioplasty implant and graft: Secondary | ICD-10-CM | POA: Diagnosis not present

## 2022-02-03 NOTE — Patient Instructions (Signed)
Medication Instructions:  Your physician recommends that you continue on your current medications as directed. Please refer to the Current Medication list given to you today.  *If you need a refill on your cardiac medications before your next appointment, please call your pharmacy*   Lab Work: LDL Direct- Today If you have labs (blood work) drawn today and your tests are completely normal, you will receive your results only by: [place] (if you have MyChart) OR A paper copy in the mail If you have any lab test that is abnormal or we need to change your treatment, we will call you to review the results.   Testing/Procedures: None Ordered   Follow-Up: At [facility], you and your health needs are our priority.  As part of our continuing mission to provide you with exceptional heart care, we have created designated Provider Care Teams.  These Care Teams include your primary Cardiologist (physician) and Advanced Practice Providers (APPs -  Physician Assistants and Nurse Practitioners) who all work together to provide you with the care you need, when you need it.  We recommend signing up for the patient portal called "MyChart".  Sign up information is provided on this After Visit Summary.  MyChart is used to connect with patients for Virtual Visits (Telemedicine).  Patients are able to view lab/test results, encounter notes, upcoming appointments, etc.  Non-urgent messages can be sent to your provider as well.   To learn more about what you can do with MyChart, go to [email or web address].    Your next appointment:   3 month(s)  The format for your next appointment:   In Person  Provider:   Jenne Campus, MD    Other Instructions NA

## 2022-02-03 NOTE — Progress Notes (Signed)
Cardiology Office Note:    Date:  02/03/2022   ID:  Dylan Mcdowell, DOB [birth year], MRN [id number]  PCP:  Esperanza Richters, NP  Cardiologist:  Jenne Campus, MD    Referring MD: Esperanza Richters, NP   Chief Complaint  Patient presents with   Follow-up    History of Present Illness:    Dylan Mcdowell is a 71 y.o. male  with past medical history significant for coronary artery disease he does have history of remote PCI which happened in 2001 then he required some atherectomy done [facility], that he got some recurrent stenosis and intervention done at [facility] have no documentation of those events.  I am not sure exactly which artery was taking care of however he was asymptomatic and no recurrence of symptoms for about 20 years.  He does have also history of essential hypertension hyperlipidemia carotic arterial disease.  Last time he seen my partner Dr. Bettina Gavia in August and he was find to have decrease in exercise tolerance without chest pain tightness squeezing pressure burning chest, however, previously when he gets symptoms also are very atypical.  Therefore stress test has been ordered.  Stress test is abnormal showing ischemia involving inferior wall  After that cardiac catheterization has been done which showed hemodynamically significant lesion in distal RCA that being addressed with overlapping drug-eluting stents.  Since that time he comes to my office is doing great.  He is asymptomatic.  Denies have any chest pain tightness squeezing pressure burning chest, he is much more happy right now he said before he could cut grass only in half of his property and then he have to rest now he can cut it with no stopping.  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 Last Assessment & Plan:  Formatting of this note might be different from the original. Concern over itching and discomfort in the ears. Chronic history of the last 6 months or so of intermittent symptoms as above.  Worse on the right side.  No recent treatment. 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 Last Assessment & Plan:  Formatting of this note might be different from the original. Concern over nasal obstruction at night. Chronic history.  No infectious sounding symptoms.  Year-round.  Really has not tried any medications.  He had septal surgery in the past. EXAM shows nasal septum slightly more to the left than the right but no   Hypothyroidism    Low back pain 08/06/2019   Formatting of this note might be different  from the original. Description:   Lumbar radiculopathy 06/26/2020   Nocturia 12/15/2018   Other chronic pain 07/01/2020   Peripheral vascular disease ([place])    Prostate cancer ([place]) 10/24/2014   Subacromial bursitis of both shoulders 07/01/2020   Ulcerative colitis, unspecified, without complications ([place]) 09/98/3382    Past Surgical History:  Procedure Laterality Date   [place] ATHERECTOMY N/A 11/05/2021   Procedure: CORONARY ATHERECTOMY;  Surgeon: Early Osmond, MD;  Location: [place] [facility];  Service: Cardiovascular;  Laterality: N/A;   CORONARY STENT INTERVENTION N/A 11/05/2021   Procedure: CORONARY STENT INTERVENTION;  Surgeon: Early Osmond, MD;  Location: [place] [facility];  Service: Cardiovascular;  Laterality: N/A;   CORONARY STENT PLACEMENT  2001   HEMORROIDECTOMY     HERNIA REPAIR  1988   INTRAVASCULAR IMAGING/OCT N/A 11/05/2021   Procedure: INTRAVASCULAR IMAGING/OCT;  Surgeon: Early Osmond, MD;  Location: [place] [facility];  Service: Cardiovascular;  Laterality: N/A;   LEFT HEART CATH AND CORONARY ANGIOGRAPHY N/A 11/05/2021   Procedure: LEFT HEART CATH AND CORONARY ANGIOGRAPHY;  Surgeon: Early Osmond, MD;  Location: [place] [facility];  Service: Cardiovascular;  Laterality: N/A;   NASAL SEPTUM SURGERY     PROSTATECTOMY     UVULECTOMY     due to snoring    Current Medications: Current Meds  Medication Sig   Ascorbic Acid 500 MG CHEW Chew 500 mg by mouth daily.   aspirin EC 81 MG tablet Take 1 tablet (81 mg total) by mouth daily. Swallow whole.   Cholecalciferol 25 MCG (1000 UT) capsule Take 1,000 Units by mouth daily.   clopidogrel (PLAVIX) 75 MG tablet Take 1 tablet (75 mg total) by mouth daily.   ezetimibe (ZETIA) 10 MG tablet Take 10 mg by mouth daily.   folic acid (FOLVITE) 505 MCG tablet Take 800 mcg by mouth daily.   levothyroxine (SYNTHROID) 75 MCG tablet Take 75 mcg by mouth daily.   melatonin 5 MG TABS Take 5 mg by mouth at bedtime as needed for sleep.   Multiple Vitamin (MULTI-VITAMIN) tablet Take 2 tablets by mouth daily.   niacin 500 MG tablet Take 500 mg by mouth daily.   nitroGLYCERIN (NITROSTAT) 0.4 MG SL tablet Place 1 tablet (0.4 mg total) under the tongue every 5 (five) minutes as needed. (Patient taking differently: Place 0.4 mg under the tongue every 5 (five) minutes as needed for chest pain.)   Omega-3  Fatty Acids (FISH OIL) 1000 MG CAPS Take 1 capsule by mouth daily.   rosuvastatin (CRESTOR) 20 MG tablet Take 1 tablet (20 mg total) by mouth daily.   tamsulosin (FLOMAX) 0.4 MG CAPS capsule Take 0.4 mg by mouth daily.     Allergies:   Patient has no known allergies.   Social History   Socioeconomic History   Marital status: Married    Spouse name: Not on file   Number of children: Not on file   Years of education: Not on file   Highest education level: Not on file  Occupational History   Not on file  Tobacco Use   Smoking status: Never    Passive exposure: Past   Smokeless tobacco: Never  Vaping Use   Vaping Use: Never used  Substance and Sexual Activity   Alcohol use: Yes    Comment: once a month   Drug use: Never   Sexual activity: Not on file  Other Topics Concern   Not  on file  Social History Narrative   Not on file   Social Determinants of Health   Financial Resource Strain: Not on file  Food Insecurity: Not on file  Transportation Needs: Not on file  Physical Activity: Not on file  Stress: Not on file  Social Connections: Not on file     Family History: The patient's family history includes Alzheimer's disease in his mother; Heart attack in his brother, brother, and father. ROS:   Please see the history of present illness.    All 14 point review of systems negative except as described per history of present illness  EKGs/Labs/Other Studies Reviewed:      Recent Labs: 11/03/2021: BUN 30; Creatinine, Ser 1.08; Hemoglobin 13.5; Platelets 213; Potassium 4.1; Sodium 143  Recent Lipid Panel No results found for: "CHOL", "TRIG", "HDL", "CHOLHDL", "VLDL", "LDLCALC", "LDLDIRECT"  Physical Exam:    VS:  BP 118/70 (BP Location: Left Arm, Patient Position: Sitting)   Pulse 72   Ht 6' (1.829 m)   Wt 180 lb 6.4 oz (81.8 kg)   SpO2 98%   BMI 24.47 kg/m     Wt Readings from Last 3 Encounters:  02/03/22 180 lb 6.4 oz (81.8 kg)  11/05/21 178 lb (80.7 kg)   11/03/21 178 lb 6.4 oz (80.9 kg)     GEN:  Well nourished, well developed in no acute distress HEENT: Normal NECK: No JVD; No carotid bruits LYMPHATICS: No lymphadenopathy CARDIAC: RRR, no murmurs, no rubs, no gallops RESPIRATORY:  Clear to auscultation without rales, wheezing or rhonchi  ABDOMEN: Soft, non-tender, non-distended MUSCULOSKELETAL:  No edema; No deformity  SKIN: Warm and dry LOWER EXTREMITIES: no swelling NEUROLOGIC:  Alert and oriented x 3 PSYCHIATRIC:  Normal affect   ASSESSMENT:    1. Coronary artery disease involving native coronary artery of native heart with other form of angina pectoris ([place])   2. Peripheral vascular disease ([place])   3. Hypercholesterolemia   4. Abnormal stress test    PLAN:    In order of problems listed above:  Coronary disease status post drug-eluting stent to distal RCA.  He is on dual antiplatelet therapy and thinking about continuation of those therapy and definitely because of prior history of reocclusion of stent.  He is asymptomatic and very satisfied with results.  Wound healed well after cardiac catheterization Peripheral vascular disease stable we will continue risk management Dyslipidemia he is on Zetia and Crestor 20 however her last K PN data I have is from June of this year with LDL 115.  We will recheck fasting lipid profile to see if it augment his therapy for cholesterol reduction. Abnormal stress test that resulted with cardiac catheterization.  Stenting done.   Medication Adjustments/Labs and Tests Ordered: Current medicines are reviewed at length with the patient today.  Concerns regarding medicines are outlined above.  No orders of the defined types were placed in this encounter.  Medication changes: No orders of the defined types were placed in this encounter.   Signed, Park Liter, MD, [facility] 02/03/2022 9:43 AM    [place]

## 2022-02-04 LAB — LDL CHOLESTEROL, DIRECT: LDL Direct: 81 mg/dL (ref 0–99)

## 2022-02-09 ENCOUNTER — Telehealth: Payer: Self-pay

## 2022-02-09 NOTE — Telephone Encounter (Signed)
LVM

## 2022-02-24 ENCOUNTER — Telehealth: Payer: Self-pay

## 2022-02-24 DIAGNOSIS — E7800 Pure hypercholesterolemia, unspecified: Secondary | ICD-10-CM | POA: Diagnosis not present

## 2022-02-24 DIAGNOSIS — Z6825 Body mass index (BMI) 25.0-25.9, adult: Secondary | ICD-10-CM | POA: Diagnosis not present

## 2022-02-24 DIAGNOSIS — E038 Other specified hypothyroidism: Secondary | ICD-10-CM | POA: Diagnosis not present

## 2022-02-24 DIAGNOSIS — Z8546 Personal history of malignant neoplasm of prostate: Secondary | ICD-10-CM | POA: Diagnosis not present

## 2022-02-24 MED ORDER — ROSUVASTATIN CALCIUM 40 MG PO TABS: 40.0000 mg | ORAL_TABLET | Freq: Every day | ORAL | 3 refills | Status: AC

## 2022-02-24 NOTE — Telephone Encounter (Signed)
-----   Message from Park Liter, MD sent at 02/09/2022 11:11 AM EST ----- Cholesterol should be better, please double the dose of Crestor to 40 mg daily, fasting lipid profile, AST LT 6 weeks

## 2022-04-01 DIAGNOSIS — Z8546 Personal history of malignant neoplasm of prostate: Secondary | ICD-10-CM | POA: Diagnosis not present

## 2022-04-01 DIAGNOSIS — B009 Herpesviral infection, unspecified: Secondary | ICD-10-CM | POA: Diagnosis not present

## 2022-04-01 DIAGNOSIS — Z79899 Other long term (current) drug therapy: Secondary | ICD-10-CM | POA: Diagnosis not present

## 2022-04-01 DIAGNOSIS — I2510 Atherosclerotic heart disease of native coronary artery without angina pectoris: Secondary | ICD-10-CM | POA: Diagnosis not present

## 2022-04-01 DIAGNOSIS — Z125 Encounter for screening for malignant neoplasm of prostate: Secondary | ICD-10-CM | POA: Diagnosis not present

## 2022-04-01 DIAGNOSIS — M25521 Pain in right elbow: Secondary | ICD-10-CM | POA: Diagnosis not present

## 2022-05-11 ENCOUNTER — Ambulatory Visit: Payer: Medicare HMO | Attending: Cardiology | Admitting: Cardiology

## 2022-05-11 ENCOUNTER — Encounter: Payer: Self-pay | Admitting: Cardiology

## 2022-05-11 VITALS — BP 120/90 | HR 60 | Ht 72.0 in | Wt 179.0 lb

## 2022-05-11 DIAGNOSIS — I6523 Occlusion and stenosis of bilateral carotid arteries: Secondary | ICD-10-CM | POA: Diagnosis not present

## 2022-05-11 DIAGNOSIS — I25118 Atherosclerotic heart disease of native coronary artery with other forms of angina pectoris: Secondary | ICD-10-CM | POA: Diagnosis not present

## 2022-05-11 DIAGNOSIS — E7800 Pure hypercholesterolemia, unspecified: Secondary | ICD-10-CM

## 2022-05-11 NOTE — Patient Instructions (Signed)
Medication Instructions:  Your physician recommends that you continue on your current medications as directed. Please refer to the Current Medication list given to you today.  *If you need a refill on your cardiac medications before your next appointment, please call your pharmacy*   Lab Work: Your physician recommends that you return for lab work in:   Labs tomorrow: Lipids/ LFT  If you have labs (blood work) drawn today and your tests are completely normal, you will receive your results only by: [place] (if you have MyChart) OR A paper copy in the mail If you have any lab test that is abnormal or we need to change your treatment, we will call you to review the results.   Testing/Procedures: None   Follow-Up: At [facility], you and your health needs are our priority.  As part of our continuing mission to provide you with exceptional heart care, we have created designated Provider Care Teams.  These Care Teams include your primary Cardiologist (physician) and Advanced Practice Providers (APPs -  Physician Assistants and Nurse Practitioners) who all work together to provide you with the care you need, when you need it.  We recommend signing up for the patient portal called "MyChart".  Sign up information is provided on this After Visit Summary.  MyChart is used to connect with patients for Virtual Visits (Telemedicine).  Patients are able to view lab/test results, encounter notes, upcoming appointments, etc.  Non-urgent messages can be sent to your provider as well.   To learn more about what you can do with MyChart, go to [email or web address].    Your next appointment:   6 month(s)  Provider:   Jenne Campus, MD    Other Instructions None

## 2022-05-11 NOTE — Progress Notes (Unsigned)
Cardiology Office Note:    Date:  05/11/2022   ID:  Dylan Mcdowell, DOB [birth year], MRN [id number]  PCP:  Dylan Richters, NP  Cardiologist:  Dylan Campus, MD    Referring MD: Dylan Richters, NP   Chief Complaint  Patient presents with   Follow-up    History of Present Illness:    Dylan Mcdowell is a 72 y.o. male   with past medical history significant for coronary artery disease he does have history of remote PCI which happened in 2001 then he required some [facility], that he got some recurrent stenosis and intervention done at [facility] have no documentation of those events.  I am not sure exactly which artery was taking care of however he was asymptomatic and no recurrence of symptoms for about 20 years.  He does have also history of essential hypertension hyperlipidemia carotic arterial disease.  Last time he seen my partner Dr. Bettina Mcdowell in August and he was find to have decrease in exercise tolerance without chest pain tightness squeezing pressure burning chest, however, previously when he gets symptoms also are very atypical.  Therefore stress test has been ordered.  Stress test is abnormal showing ischemia involving inferior wall  After that cardiac catheterization has been done which showed hemodynamically significant lesion in distal RCA that being addressed with overlapping drug-eluting stents.  Since that time he comes to my office is doing great.  He is asymptomatic.  Comes today to months for follow-up.  Overall doing very well.  Still exercising on the regular basis have no difficulty doing it initially to participate in cardiac rehab now he does not his own completely asymptomatic and very happy the way he feels  Past Medical History:  Diagnosis Date   Altered bowel habits 05/19/2016   Atherosclerotic heart disease of native coronary artery without angina pectoris    Benign prostatic hyperplasia  with urinary obstruction 10/03/2014   Bursitis of left shoulder 07/01/2020   Carotid artery stenosis    Cervical myelopathy (HCC) 06/26/2020   Chronic eczematous otitis externa of right ear 01/24/2017   Formatting of this note might be different from the original. Last Assessment & Plan:  Formatting of this note might be different from the original. Concern over itching and discomfort in the ears. Chronic history of the last 6 months or so of intermittent symptoms as above.  Worse on the right side.  No recent treatment. 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 Last Assessment & Plan:  Formatting of this note might be different from the original. Concern over nasal obstruction at night. Chronic history.  No infectious sounding symptoms.  Year-round.  Really has not tried any medications.  He had septal surgery in the past. EXAM shows nasal septum slightly more to the left than the right but no   Hypothyroidism    Low back pain 08/06/2019   Formatting of this note might be different from the original. Description:  Lumbar radiculopathy 06/26/2020   Nocturia 12/15/2018   Other chronic pain 07/01/2020   Peripheral vascular disease ([place])    Prostate cancer ([place]) 10/24/2014   Subacromial bursitis of both shoulders 07/01/2020   Ulcerative colitis, unspecified, without complications ([place]) 0000000    Past Surgical History:  Procedure Laterality Date   APPENDECTOMY  1965   CORONARY  ATHERECTOMY N/A 11/05/2021   Procedure: CORONARY ATHERECTOMY;  Surgeon: Dylan Osmond, MD;  Location: [facility] [facility];  Service: Cardiovascular;  Laterality: N/A;   CORONARY STENT INTERVENTION N/A 11/05/2021   Procedure: CORONARY STENT INTERVENTION;  Surgeon: Dylan Osmond, MD;  Location: [place] [facility];  Service: Cardiovascular;  Laterality: N/A;   CORONARY STENT PLACEMENT  2001   HEMORROIDECTOMY     HERNIA REPAIR  1988   INTRAVASCULAR IMAGING/OCT N/A 11/05/2021   Procedure: INTRAVASCULAR IMAGING/OCT;  Surgeon: Dylan Osmond, MD;  Location: [place];  Service: Cardiovascular;  Laterality: N/A;   LEFT HEART CATH AND CORONARY ANGIOGRAPHY N/A 11/05/2021   Procedure: LEFT HEART CATH AND CORONARY ANGIOGRAPHY;  Surgeon: Dylan Osmond, MD;  Location: [facility];  Service: Cardiovascular;  Laterality: N/A;   NASAL SEPTUM SURGERY     PROSTATECTOMY     UVULECTOMY     due to snoring    Current Medications: Current Meds  Medication Sig   Ascorbic Acid 500 MG CHEW Chew 500 mg by mouth daily.   aspirin EC 81 MG tablet Take 1 tablet (81 mg total) by mouth daily. Swallow whole.   Cholecalciferol 25 MCG (1000 UT) capsule Take 1,000 Units by mouth daily.   clopidogrel (PLAVIX) 75 MG tablet Take 1 tablet (75 mg total) by mouth daily.   ezetimibe (ZETIA) 10 MG tablet Take 10 mg by mouth daily.   folic acid (FOLVITE) Q000111Q MCG tablet Take 800 mcg by mouth daily.   levothyroxine (SYNTHROID) 75 MCG tablet Take 75 mcg by mouth daily.   melatonin 5 MG TABS Take 5 mg by mouth at bedtime as needed for sleep.   Multiple Vitamin (MULTI-VITAMIN) tablet Take 2 tablets by mouth daily.   niacin 500 MG tablet Take 500 mg by mouth daily.   nitroGLYCERIN (NITROSTAT) 0.4 MG SL tablet Place 1 tablet (0.4 mg total) under the tongue every 5 (five) minutes as needed. (Patient taking differently: Place 0.4 mg under the tongue every 5 (five) minutes as needed for chest pain.)   Omega-3 Fatty  Acids (FISH OIL) 1000 MG CAPS Take 1 capsule by mouth daily.   rosuvastatin (CRESTOR) 40 MG tablet Take 1 tablet (40 mg total) by mouth daily.   tamsulosin (FLOMAX) 0.4 MG CAPS capsule Take 0.4 mg by mouth daily.     Allergies:   Patient has no known allergies.   Social History   Socioeconomic History   Marital status: Married    Spouse name: Not on file   Number of children: Not on file   Years of education: Not on file   Highest education level: Not on file  Occupational History   Not on file  Tobacco Use   Smoking status: Never    Passive exposure: Past   Smokeless tobacco: Never  Vaping Use   Vaping Use: Never used  Substance and Sexual Activity   Alcohol use: Yes    Comment: once a month   Drug use: Never   Sexual activity: Not on file  Other Topics Concern   Not on file  Social History Narrative  Not on file   Social Determinants of Health   Financial Resource Strain: Not on file  Food Insecurity: Not on file  Transportation Needs: Not on file  Physical Activity: Not on file  Stress: Not on file  Social Connections: Not on file     Family History: The patient's family history includes Alzheimer's disease in his mother; Heart attack in his brother, brother, and father. ROS:   Please see the history of present illness.    All 14 point review of systems negative except as described per history of present illness  EKGs/Labs/Other Studies Reviewed:      Recent Labs: 11/03/2021: BUN 30; Creatinine, Ser 1.08; Hemoglobin 13.5; Platelets 213; Potassium 4.1; Sodium 143  Recent Lipid Panel    Component Value Date/Time   LDLDIRECT 81 02/03/2022 1002    Physical Exam:    VS:  BP (!) 120/90 (BP Location: Left Arm, Patient Position: Sitting, Cuff Size: Normal)   Pulse 60   Ht 6' (1.829 m)   Wt 179 lb (81.2 kg)   SpO2 99%   BMI 24.28 kg/m     Wt Readings from Last 3 Encounters:  05/11/22 179 lb (81.2 kg)  02/03/22 180 lb 6.4 oz (81.8 kg)  11/05/21 178  lb (80.7 kg)     GEN:  Well nourished, well developed in no acute distress HEENT: Normal NECK: No JVD; No carotid bruits LYMPHATICS: No lymphadenopathy CARDIAC: RRR, no murmurs, no rubs, no gallops RESPIRATORY:  Clear to auscultation without rales, wheezing or rhonchi  ABDOMEN: Soft, non-tender, non-distended MUSCULOSKELETAL:  No edema; No deformity  SKIN: Warm and dry LOWER EXTREMITIES: no swelling NEUROLOGIC:  Alert and oriented x 3 PSYCHIATRIC:  Normal affect   ASSESSMENT:    1. Hypercholesterolemia   2. Coronary artery disease involving native coronary artery of native heart with other form of angina pectoris ([place])   3. Bilateral carotid artery stenosis    PLAN:    In order of problems listed above:  Hypercholesterolemia I did review K PN which show me data from summer 2023 LDL 115 and HDL 48.  Will schedule him to have another fasting lipid profile done.  In the meantime continue high intense statin. Coronary artery disease doing well from that point review continue dual antiplatelet therapy probably undefinitely Bilateral carotic arterial stenosis I am not sure when this diagnosis came from I do see carotic ultrasound done in 2021 which showed basically normal coronaries.  Will drop the diagnosis. We did talk about healthy lifestyle need to exercise on the regular basis which he does already I discussed basic of Mediterranean diet with him.   Medication Adjustments/Labs and Tests Ordered: Current medicines are reviewed at length with the patient today.  Concerns regarding medicines are outlined above.  Orders Placed This Encounter  Procedures   Lipid Profile   Hepatic function panel   Medication changes: No orders of the defined types were placed in this encounter.   Signed, Park Liter, MD, [facility] 05/11/2022 11:51 AM    [place]

## 2022-05-13 DIAGNOSIS — E7800 Pure hypercholesterolemia, unspecified: Secondary | ICD-10-CM | POA: Diagnosis not present

## 2022-05-14 LAB — HEPATIC FUNCTION PANEL
ALT: 26 IU/L (ref 0–44)
AST: 27 IU/L (ref 0–40)
Albumin: 4.2 g/dL (ref 3.8–4.8)
Alkaline Phosphatase: 66 IU/L (ref 44–121)
Bilirubin Total: 0.4 mg/dL (ref 0.0–1.2)
Bilirubin, Direct: 0.13 mg/dL (ref 0.00–0.40)
Total Protein: 6.6 g/dL (ref 6.0–8.5)

## 2022-05-14 LAB — LIPID PANEL
Chol/HDL Ratio: 2.3 ratio (ref 0.0–5.0)
Cholesterol, Total: 127 mg/dL (ref 100–199)
HDL: 55 mg/dL (ref >39)
LDL Chol Calc (NIH): 58 mg/dL (ref 0–99)
Triglycerides: 66 mg/dL (ref 0–149)
VLDL Cholesterol Cal: 14 mg/dL (ref 5–40)

## 2022-05-18 ENCOUNTER — Telehealth: Payer: Self-pay

## 2022-05-18 NOTE — Telephone Encounter (Signed)
Results reviewed with pt as per Dr. Krasowski's note.  Pt verbalized understanding and had no additional questions. Routed to PCP  

## 2022-07-15 DIAGNOSIS — E039 Hypothyroidism, unspecified: Secondary | ICD-10-CM | POA: Diagnosis not present

## 2022-07-15 DIAGNOSIS — E7800 Pure hypercholesterolemia, unspecified: Secondary | ICD-10-CM | POA: Diagnosis not present

## 2022-07-15 DIAGNOSIS — Z8546 Personal history of malignant neoplasm of prostate: Secondary | ICD-10-CM | POA: Diagnosis not present

## 2022-07-16 DIAGNOSIS — E039 Hypothyroidism, unspecified: Secondary | ICD-10-CM | POA: Diagnosis not present

## 2022-07-16 DIAGNOSIS — E7800 Pure hypercholesterolemia, unspecified: Secondary | ICD-10-CM | POA: Diagnosis not present

## 2022-07-28 ENCOUNTER — Telehealth: Payer: Self-pay | Admitting: Cardiology

## 2022-07-28 NOTE — Telephone Encounter (Signed)
Pt is requesting a call back regarding his left arm aching more lately. No other symptoms. Please advise

## 2022-07-28 NOTE — Telephone Encounter (Signed)
Called patient and he reported that his left arm has started aching and he has recently become more SOB while exerting himself. He is also feeling more fatigued as well. Patient also reported that he was having the same symptoms last summer and he ended up having to have a stent at that time. After that he went through 12 weeks of cardiac rehab and was recently released from the rehab. Patient requested to have an appointment with Dr. Bing Matter. An appointment was scheduled for him to see Dr. Bing Matter on 07/30/22 at 8:00 am. Patient had no further questions at this time.

## 2022-07-30 ENCOUNTER — Ambulatory Visit: Payer: Medicare HMO | Attending: Cardiology | Admitting: Cardiology

## 2022-07-30 ENCOUNTER — Encounter: Payer: Self-pay | Admitting: Cardiology

## 2022-07-30 VITALS — BP 130/72 | HR 96 | Ht 72.0 in | Wt 183.8 lb

## 2022-07-30 DIAGNOSIS — I739 Peripheral vascular disease, unspecified: Secondary | ICD-10-CM

## 2022-07-30 DIAGNOSIS — E039 Hypothyroidism, unspecified: Secondary | ICD-10-CM | POA: Diagnosis not present

## 2022-07-30 DIAGNOSIS — E7800 Pure hypercholesterolemia, unspecified: Secondary | ICD-10-CM | POA: Diagnosis not present

## 2022-07-30 DIAGNOSIS — I25118 Atherosclerotic heart disease of native coronary artery with other forms of angina pectoris: Secondary | ICD-10-CM

## 2022-07-30 MED ORDER — RANOLAZINE ER 500 MG PO TB12: 500.0000 mg | ORAL_TABLET | Freq: Two times a day (BID) | ORAL | 6 refills | Status: DC

## 2022-07-30 MED ORDER — NITROGLYCERIN 0.4 MG SL SUBL: 0.4000 mg | SUBLINGUAL_TABLET | SUBLINGUAL | 2 refills | Status: AC | PRN

## 2022-07-30 NOTE — Progress Notes (Signed)
Cardiology Office Note:    Date:  07/30/2022   ID:  Dylan Mcdowell, DOB 1950/12/23, MRN [id number]  PCP:  Karilyn Cota, NP  Cardiologist:  Gypsy Balsam, MD    Referring MD: Karilyn Cota, NP   Chief Complaint  Patient presents with   Fatigue   L arm pain    Ongoing 1 week    History of Present Illness:    Dylan Mcdowell is a 72 y.o. male   with past medical history significant for coronary artery disease he does have history of remote PCI which happened in 2001 then he required some [facility], that he got some recurrent stenosis and intervention done at [facility] have no documentation of those events.  I am not sure exactly which artery was taking care of however he was asymptomatic and no recurrence of symptoms for about 20 years.  He does have also history of essential hypertension hyperlipidemia carotic arterial disease.  Last time he seen my partner Dr. Dulce Sellar in August and he was find to have decrease in exercise tolerance without chest pain tightness squeezing pressure burning chest, however, previously when he gets symptoms also are very atypical.  Therefore stress test has been ordered.  Stress test is abnormal showing ischemia involving inferior wall  After that cardiac catheterization has been done which showed hemodynamically significant lesion in distal RCA that being addressed with overlapping drug-eluting stents He requested to be seen because for about a week or 2 he started experiencing similar symptoms to before.  Not as bad he said it is about 50% what it was before but he noticed before he was able to cut grass and tired appropriate and no problem now he have to stop halfway through because of fatigue tiredness and some chest pain.  Again it is not as bad as he used to before.  He did not try nitroglycerin.  Obviously concerned about it  Past Medical History:  Diagnosis Date   Altered  bowel habits 05/19/2016   Atherosclerotic heart disease of native coronary artery without angina pectoris    Benign prostatic hyperplasia with urinary obstruction 10/03/2014   Bursitis of left shoulder 07/01/2020   Carotid artery stenosis    Cervical myelopathy (HCC) 06/26/2020   Chronic eczematous otitis externa of right ear 01/24/2017   Formatting of this note might be different from the original. Last Assessment & Plan:  Formatting of this note might be different from the original. Concern over itching and discomfort in the ears. Chronic history of the last 6 months or so of intermittent symptoms as above.  Worse on the right side.  No recent treatment. 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 Last Assessment & Plan:  Formatting of this note might be different from the original. Concern over nasal obstruction at night. Chronic history.  No infectious sounding symptoms.  Year-round.  Really has not tried any medications.  He had septal surgery in the past.  EXAM shows nasal septum slightly more to the left than the right but no   Hypothyroidism    Low back pain 08/06/2019   Formatting of this note might be different from the original. Description:   Lumbar radiculopathy 06/26/2020   Nocturia 12/15/2018   Other chronic pain 07/01/2020   Peripheral vascular disease (HCC)    Prostate cancer (HCC) 10/24/2014   Subacromial bursitis of both shoulders 07/01/2020   Ulcerative colitis,  unspecified, without complications (HCC) 05/19/2016    Past Surgical History:  Procedure Laterality Date   APPENDECTOMY  1965   CORONARY ATHERECTOMY N/A 11/05/2021   Procedure: CORONARY ATHERECTOMY;  Surgeon: Orbie Pyo, MD;  [facility];  Service: Cardiovascular;  Laterality: N/A;   CORONARY IMAGING/OCT N/A 11/05/2021   Procedure: INTRAVASCULAR IMAGING/OCT;  Surgeon: Orbie Pyo, MD;  Location: [facility];  Service: Cardiovascular;  Laterality: N/A;   CORONARY STENT INTERVENTION N/A 11/05/2021   Procedure: CORONARY STENT INTERVENTION;  Surgeon: Orbie Pyo, MD;  Location: [facility];  Service: Cardiovascular;  Laterality: N/A;   CORONARY STENT PLACEMENT  2001   HEMORROIDECTOMY     HERNIA REPAIR  1988   LEFT HEART CATH AND CORONARY ANGIOGRAPHY N/A 11/05/2021   Procedure: LEFT HEART CATH AND CORONARY ANGIOGRAPHY;  Surgeon: Orbie Pyo, MD;  [facility]: [facility];  Service: Cardiovascular;  Laterality: N/A;   NASAL SEPTUM SURGERY     PROSTATECTOMY     UVULECTOMY     due to snoring    Current Medications: Current Meds  Medication Sig   Ascorbic Acid 500 MG CHEW Chew 500 mg by mouth daily.   aspirin EC 81 MG tablet Take 1 tablet (81 mg total) by mouth daily. Swallow whole.   Cholecalciferol 25 MCG (1000 UT) capsule Take 1,000 Units by mouth daily.   clopidogrel (PLAVIX) 75 MG tablet Take 1 tablet (75 mg total) by mouth daily.   ezetimibe (ZETIA) 10 MG tablet Take 10 mg by mouth daily.   folic acid (FOLVITE) 800 MCG tablet Take 800 mcg by mouth daily.   levothyroxine (SYNTHROID) 75 MCG tablet Take 75 mcg by mouth daily.   melatonin 5 MG TABS Take 5 mg by mouth at bedtime as needed for sleep.   Multiple Vitamin (MULTI-VITAMIN) tablet Take 2 tablets by mouth daily.   niacin 500 MG tablet Take 500 mg by mouth daily.   nitroGLYCERIN (NITROSTAT) 0.4 MG SL tablet Place 1 tablet (0.4 mg total) under the tongue every 5 (five) minutes as  needed. (Patient taking differently: Place 0.4 mg under the tongue every 5 (five) minutes as needed for chest pain.)   Omega-3 Fatty Acids (FISH OIL) 1000 MG CAPS Take 1 capsule by mouth daily.   rosuvastatin (CRESTOR) 40 MG tablet Take 1 tablet (40 mg total) by mouth daily.   tamsulosin (FLOMAX) 0.4 MG CAPS capsule Take 0.4 mg by mouth daily.     Allergies:   Patient has no known allergies.   Social History   Socioeconomic History   Marital status: Married    Spouse name: Not on file   Number of children: Not on file   Years of education: Not on file   Highest education level: Not on file  Occupational History   Not on file  Tobacco Use   Smoking status: Never    Passive exposure: Past   Smokeless tobacco: Never  Vaping Use   Vaping Use: Never used  Substance and  Sexual Activity   Alcohol use: Yes    Comment: once a month   Drug use: Never   Sexual activity: Not on file  Other Topics Concern   Not on file  Social History Narrative   Not on file   Social Determinants of Health   Financial Resource Strain: Not on file  Food Insecurity: Not on file  Transportation Needs: Not on file  Physical Activity: Not on file  Stress: Not on file  Social Connections: Not on file     Family History: The patient's family history includes Alzheimer's disease in his mother; Heart attack in his brother, brother, and father. ROS:   Please see the history of present illness.    All 14 point review of systems negative except as described per history of present illness  EKGs/Labs/Other Studies Reviewed:      Recent Labs: 11/03/2021: BUN 30; Creatinine, Ser 1.08; Hemoglobin 13.5; Platelets 213; Potassium 4.1; Sodium 143 05/13/2022: ALT 26  Recent Lipid Panel    Component Value Date/Time   CHOL 127 05/13/2022 0837   TRIG 66 05/13/2022 0837   HDL 55 05/13/2022 0837   CHOLHDL 2.3 05/13/2022 0837   LDLCALC 58 05/13/2022 0837   LDLDIRECT 81 02/03/2022 1002    Physical Exam:     VS:  BP 130/72 (BP Location: Left Arm, Patient Position: Sitting)   Pulse 96   Ht 6' (1.829 m)   Wt 183 lb 12.8 oz (83.4 kg)   SpO2 98%   BMI 24.93 kg/m     Wt Readings from Last 3 Encounters:  07/30/22 183 lb 12.8 oz (83.4 kg)  05/11/22 179 lb (81.2 kg)  02/03/22 180 lb 6.4 oz (81.8 kg)     GEN:  Well nourished, well developed in no acute distress HEENT: Normal NECK: No JVD; No carotid bruits LYMPHATICS: No lymphadenopathy CARDIAC: RRR, no murmurs, no rubs, no gallops RESPIRATORY:  Clear to auscultation without rales, wheezing or rhonchi  ABDOMEN: Soft, non-tender, non-distended MUSCULOSKELETAL:  No edema; No deformity  SKIN: Warm and dry LOWER EXTREMITIES: no swelling NEUROLOGIC:  Alert and oriented x 3 PSYCHIATRIC:  Normal affect   ASSESSMENT:    1. Coronary artery disease involving native coronary artery of native heart with other form of angina pectoris (HCC)   2. Peripheral vascular disease (HCC)   3. Acquired hypothyroidism   4. Hypercholesterolemia    PLAN:    In order of problems listed above:  Coronary disease look like he gets reactivation of the problem.  I will initiate ranolazine 500 mg twice daily, I will continue dual antiplatelet therapy, I will give her nitroglycerin as needed to take.  I told him that we see [place] again about a month however if this sensation become more frequent he need to let me know if it became unrelieved by nitroglycerin he need to call 911 go to the emergency room.  I will try to avoid another testing and cardiac catheterization, however if pain will be uncontrolled by nitroglycerin and medications that we will consider going back to cardiac catheter but may be stress testing trend to determine where the problem is coming from. Peripheral vascular disease stable continue antiplatelet therapy as well as lipid management. Dyslipidemia I did review his K PN which show me his LDL 58 HDL 55 this is from February 22nd 2024, will continue  present management.   Medication Adjustments/Labs and Tests Ordered: Current medicines are reviewed at length with the patient today.  Concerns regarding medicines are  outlined above.  No orders of the defined types were placed in this encounter.  Medication changes: No orders of the defined types were placed in this encounter.   Signed, Georgeanna Lea, MD, [facility] 07/30/2022 8:47 AM    [facility]

## 2022-07-30 NOTE — Patient Instructions (Signed)
Medication Instructions:   START: Ranolazine 500mg  1 twice daily   Lab Work: None Ordered If you have labs (blood work) drawn today and your tests are completely normal, you will receive your results only by: MyChart Message (if you have MyChart) OR A paper copy in the mail If you have any lab test that is abnormal or we need to change your treatment, we will call you to review the results.   Testing/Procedures: None Ordered   Follow-Up: At [facility], you and your health needs are our priority.  As part of our continuing mission to provide you with exceptional heart care, we have created designated Provider Care Teams.  These Care Teams include your primary Cardiologist (physician) and Advanced Practice Providers (APPs -  Physician Assistants and Nurse Practitioners) who all work together to provide you with the care you need, when you need it.  We recommend signing up for the patient portal called "MyChart".  Sign up information is provided on this After Visit Summary.  MyChart is used to connect with patients for Virtual Visits (Telemedicine).  Patients are able to view lab/test results, encounter notes, upcoming appointments, etc.  Non-urgent messages can be sent to your provider as well.   To learn more about what you can do with MyChart, go to [email or web address].    Your next appointment:   1 month(s)  The format for your next appointment:   In Person  Provider:   Gypsy Balsam, MD    Other Instructions NA

## 2022-07-30 NOTE — Addendum Note (Signed)
Addended by: Baldo Ash D on: 07/30/2022 09:03 AM   Modules accepted: Orders

## 2022-09-02 ENCOUNTER — Encounter: Payer: Self-pay | Admitting: Cardiology

## 2022-09-02 ENCOUNTER — Ambulatory Visit: Payer: Medicare HMO | Attending: Cardiology | Admitting: Cardiology

## 2022-09-02 VITALS — BP 110/64 | HR 71 | Ht 72.0 in | Wt 180.6 lb

## 2022-09-02 DIAGNOSIS — E7800 Pure hypercholesterolemia, unspecified: Secondary | ICD-10-CM | POA: Diagnosis not present

## 2022-09-02 DIAGNOSIS — I25118 Atherosclerotic heart disease of native coronary artery with other forms of angina pectoris: Secondary | ICD-10-CM

## 2022-09-02 DIAGNOSIS — I739 Peripheral vascular disease, unspecified: Secondary | ICD-10-CM | POA: Diagnosis not present

## 2022-09-02 NOTE — Progress Notes (Signed)
Cardiology Office Note:    Date:  09/02/2022   ID:  Dylan Mcdowell, DOB October 16, 1950, MRN [id number]  PCP:  Karilyn Cota, NP  Cardiologist:  Gypsy Balsam, MD    Referring MD: Karilyn Cota, NP   Chief Complaint  Patient presents with   Follow-up  Doing much better  History of Present Illness:    Dylan Mcdowell is a 72 y.o. male   with past medical history significant for coronary artery disease he does have history of remote PCI which happened in 2001 then he required some atherectomy done by Dr. Richardson Chiquito [facility], that he got some recurrent stenosis and intervention done at [facility] have no documentation of those events.  I am not sure exactly which artery was taking care of however he was asymptomatic and no recurrence of symptoms for about 20 years.  He does have also history of essential hypertension hyperlipidemia carotic arterial disease.  Last time he seen my partner Dr. Dulce Sellar in August and he was find to have decrease in exercise tolerance without chest pain tightness squeezing pressure burning chest, however, previously when he gets symptoms also are very atypical.  Therefore stress test has been ordered.  Stress test is abnormal showing ischemia involving inferior wall  After that cardiac catheterization has been done which showed hemodynamically significant lesion in distal RCA that being addressed with overlapping drug-eluting stents.  However during the process some small branches being occluded. Last time I seen him he was complaining of having some chest pain exertional put him on the ranolazine and brought him back today to my office.  He is doing great he is asymptomatic denies have any chest pain tightness squeezing pressure mid chest.  Past Medical History:  Diagnosis Date   Altered bowel habits 05/19/2016   Atherosclerotic heart disease of native coronary artery without angina pectoris    Benign prostatic hyperplasia  with urinary obstruction 10/03/2014   Bursitis of left shoulder 07/01/2020   Carotid artery stenosis    Cervical myelopathy (HCC) 06/26/2020   Chronic eczematous otitis externa of right ear 01/24/2017   Formatting of this note might be different from the original. Last Assessment & Plan:  Formatting of this note might be different from the original. Concern over itching and discomfort in the ears. Chronic history of the last 6 months or so of intermittent symptoms as above.  Worse on the right side.  No recent treatment. 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 Last Assessment & Plan:  Formatting of this note might be different from the original. Concern over nasal obstruction at night. Chronic history.  No infectious sounding symptoms.  Year-round.  Really has not tried any medications.  He had septal surgery in the past. EXAM shows nasal septum slightly more to the left than the right but no   Hypothyroidism    Low back pain 08/06/2019   Formatting of this note might be different from the original. Description:   Lumbar radiculopathy  06/26/2020   Nocturia 12/15/2018   Other chronic pain 07/01/2020   Peripheral vascular disease (HCC)    Prostate cancer (HCC) 10/24/2014   Subacromial bursitis of both shoulders 07/01/2020   Ulcerative colitis, unspecified, without complications (HCC) 05/19/2016    Past Surgical History:  Procedure Laterality Date   APPENDECTOMY  1965   CORONARY  ATHERECTOMY N/A 11/05/2021   Procedure: CORONARY ATHERECTOMY;  Surgeon: Orbie Pyo, MD;  Location: [facility];  Service: Cardiovascular;  Laterality: N/A;   CORONARY IMAGING/OCT N/A 11/05/2021   Procedure: INTRAVASCULAR IMAGING/OCT;  Surgeon: Orbie Pyo, MD;  Location: [facility];  Service: Cardiovascular;  Laterality: N/A;   CORONARY STENT INTERVENTION N/A 11/05/2021   Procedure: CORONARY STENT INTERVENTION;  Surgeon: Orbie Pyo, MD;  Location: [facility];  Service: Cardiovascular;  Laterality: N/A;   CORONARY STENT PLACEMENT  2001   HEMORROIDECTOMY     HERNIA REPAIR  1988   LEFT HEART CATH AND CORONARY ANGIOGRAPHY N/A 11/05/2021   Procedure: LEFT HEART CATH AND CORONARY ANGIOGRAPHY;  Surgeon: Orbie Pyo, MD;  Location: [facility];  Service: Cardiovascular;  Laterality: N/A;   NASAL SEPTUM SURGERY     PROSTATECTOMY     UVULECTOMY     due to snoring    Current Medications: Current Meds  Medication Sig   Ascorbic Acid 500 MG CHEW Chew 500 mg by mouth daily.   aspirin EC 81 MG tablet Take 1 tablet (81 mg total) by mouth daily. Swallow whole.   Cholecalciferol 25 MCG (1000 UT) capsule Take 1,000 Units by mouth daily.   clopidogrel (PLAVIX) 75 MG tablet Take 1 tablet (75 mg total) by mouth daily.   ezetimibe (ZETIA) 10 MG tablet Take 10 mg by mouth daily.   folic acid (FOLVITE) 800 MCG tablet Take 800 mcg by mouth daily.   levothyroxine (SYNTHROID) 75 MCG tablet Take 75 mcg by mouth daily.   melatonin 5 MG TABS Take 5 mg by mouth at bedtime as needed for sleep.   Multiple Vitamin (MULTI-VITAMIN) tablet Take 2 tablets by mouth daily.   niacin 500 MG tablet Take 500 mg by mouth daily.   nitroGLYCERIN (NITROSTAT) 0.4 MG SL tablet Place 1 tablet (0.4 mg total) under the tongue every 5 (five) minutes as needed. (Patient taking differently: Place 0.4 mg under the tongue every 5 (five) minutes as needed for chest pain.)   Omega-3 Fatty Acids  (FISH OIL) 1000 MG CAPS Take 1 capsule by mouth daily.   ranolazine (RANEXA) 500 MG 12 hr tablet Take 1 tablet (500 mg total) by mouth 2 (two) times daily.   rosuvastatin (CRESTOR) 40 MG tablet Take 1 tablet (40 mg total) by mouth daily.   tamsulosin (FLOMAX) 0.4 MG CAPS capsule Take 0.4 mg by mouth daily.     Allergies:   Patient has no known allergies.   Social History   Socioeconomic History   Marital status: Married    Spouse name: Not on file   Number of children: Not on file   Years of education: Not on file   Highest education level: Not on file  Occupational History   Not on file  Tobacco Use   Smoking status: Never    Passive exposure: Past   Smokeless tobacco: Never  Vaping Use   Vaping Use: Never used  Substance and Sexual Activity   Alcohol use: Yes    Comment: once a month   Drug use: Never  Sexual activity: Not on file  Other Topics Concern   Not on file  Social History Narrative   Not on file   Social Determinants of Health   Financial Resource Strain: Not on file  Food Insecurity: Not on file  Transportation Needs: Not on file  Physical Activity: Not on file  Stress: Not on file  Social Connections: Not on file     Family History: The patient's family history includes Alzheimer's disease in his mother; Heart attack in his brother, brother, and father. ROS:   Please see the history of present illness.    All 14 point review of systems negative except as described per history of present illness  EKGs/Labs/Other Studies Reviewed:      Recent Labs: 11/03/2021: BUN 30; Creatinine, Ser 1.08; Hemoglobin 13.5; Platelets 213; Potassium 4.1; Sodium 143 05/13/2022: ALT 26  Recent Lipid Panel    Component Value Date/Time   CHOL 127 05/13/2022 0837   TRIG 66 05/13/2022 0837   HDL 55 05/13/2022 0837   CHOLHDL 2.3 05/13/2022 0837   LDLCALC 58 05/13/2022 0837   LDLDIRECT 81 02/03/2022 1002    Physical Exam:    VS:  BP 110/64 (BP Location: Left Arm,  Patient Position: Sitting)   Pulse 71   Ht 6' (1.829 m)   Wt 180 lb 9.6 oz (81.9 kg)   SpO2 95%   BMI 24.49 kg/m     Wt Readings from Last 3 Encounters:  09/02/22 180 lb 9.6 oz (81.9 kg)  07/30/22 183 lb 12.8 oz (83.4 kg)  05/11/22 179 lb (81.2 kg)     GEN:  Well nourished, well developed in no acute distress HEENT: Normal NECK: No JVD; No carotid bruits LYMPHATICS: No lymphadenopathy CARDIAC: RRR, no murmurs, no rubs, no gallops RESPIRATORY:  Clear to auscultation without rales, wheezing or rhonchi  ABDOMEN: Soft, non-tender, non-distended MUSCULOSKELETAL:  No edema; No deformity  SKIN: Warm and dry LOWER EXTREMITIES: no swelling NEUROLOGIC:  Alert and oriented x 3 PSYCHIATRIC:  Normal affect   ASSESSMENT:    1. Coronary artery disease involving native coronary artery of native heart with other form of angina pectoris (HCC)   2. Peripheral vascular disease (HCC)   3. Hypercholesterolemia    PLAN:    In order of problems listed above:  Coronary disease stable from that point review right now.  Will continue conservative management.  He is doing well asymptomatic. Peripheral vascular disease stable on appropriate guideline directed medical therapy which includes dual antiplatelet therapy which I will continue for now. Dyslipidemia I did review K PN which show me his LDL of 58 HDL 55.  He is on Zetia as well as on Crestor 40 which I will continue. 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  Concerns regarding medicines are outlined above.  No orders of the defined types were placed in this encounter.  Medication changes: No orders of the defined types were placed in this encounter.   Signed, Georgeanna Lea, MD,  [facility] 09/02/2022 11:18 AM    [facility]

## 2022-09-02 NOTE — Patient Instructions (Signed)

## 2022-09-13 DIAGNOSIS — Z9181 History of falling: Secondary | ICD-10-CM | POA: Diagnosis not present

## 2022-09-13 DIAGNOSIS — E7800 Pure hypercholesterolemia, unspecified: Secondary | ICD-10-CM | POA: Diagnosis not present

## 2022-09-13 DIAGNOSIS — I2510 Atherosclerotic heart disease of native coronary artery without angina pectoris: Secondary | ICD-10-CM | POA: Diagnosis not present

## 2022-09-13 DIAGNOSIS — R0981 Nasal congestion: Secondary | ICD-10-CM | POA: Diagnosis not present

## 2022-09-13 DIAGNOSIS — Z6825 Body mass index (BMI) 25.0-25.9, adult: Secondary | ICD-10-CM | POA: Diagnosis not present

## 2022-09-13 DIAGNOSIS — Z0000 Encounter for general adult medical examination without abnormal findings: Secondary | ICD-10-CM | POA: Diagnosis not present

## 2022-09-13 DIAGNOSIS — Z8546 Personal history of malignant neoplasm of prostate: Secondary | ICD-10-CM | POA: Diagnosis not present

## 2022-09-13 DIAGNOSIS — Z1389 Encounter for screening for other disorder: Secondary | ICD-10-CM | POA: Diagnosis not present

## 2022-09-13 DIAGNOSIS — E039 Hypothyroidism, unspecified: Secondary | ICD-10-CM | POA: Diagnosis not present

## 2022-09-27 DIAGNOSIS — H52223 Regular astigmatism, bilateral: Secondary | ICD-10-CM | POA: Diagnosis not present

## 2022-11-13 DIAGNOSIS — L259 Unspecified contact dermatitis, unspecified cause: Secondary | ICD-10-CM | POA: Diagnosis not present

## 2022-12-28 ENCOUNTER — Other Ambulatory Visit: Payer: Self-pay | Admitting: Cardiology

## 2022-12-28 NOTE — Telephone Encounter (Signed)
This is a [place] pt °

## 2022-12-28 NOTE — Telephone Encounter (Signed)
Rx refill sent to pharmacy. 

## 2023-02-24 DIAGNOSIS — Z6824 Body mass index (BMI) 24.0-24.9, adult: Secondary | ICD-10-CM | POA: Diagnosis not present

## 2023-02-24 DIAGNOSIS — E7800 Pure hypercholesterolemia, unspecified: Secondary | ICD-10-CM | POA: Diagnosis not present

## 2023-02-24 DIAGNOSIS — E039 Hypothyroidism, unspecified: Secondary | ICD-10-CM | POA: Diagnosis not present

## 2023-02-24 DIAGNOSIS — I2510 Atherosclerotic heart disease of native coronary artery without angina pectoris: Secondary | ICD-10-CM | POA: Diagnosis not present

## 2023-03-21 ENCOUNTER — Ambulatory Visit: Payer: Medicare HMO | Attending: Cardiology | Admitting: Cardiology

## 2023-03-21 ENCOUNTER — Encounter: Payer: Self-pay | Admitting: Cardiology

## 2023-03-21 VITALS — BP 116/68 | HR 80 | Ht 72.0 in | Wt 176.0 lb

## 2023-03-21 DIAGNOSIS — I25118 Atherosclerotic heart disease of native coronary artery with other forms of angina pectoris: Secondary | ICD-10-CM

## 2023-03-21 DIAGNOSIS — I739 Peripheral vascular disease, unspecified: Secondary | ICD-10-CM | POA: Diagnosis not present

## 2023-03-21 DIAGNOSIS — E7800 Pure hypercholesterolemia, unspecified: Secondary | ICD-10-CM | POA: Diagnosis not present

## 2023-03-21 NOTE — Progress Notes (Signed)
Cardiology Office Note:    Date:  03/21/2023   ID:  Dylan Mcdowell, DOB [birth year], MRN [id number]  PCP:  Karilyn Cota, NP  Cardiologist:  Gypsy Balsam, MD    Referring MD: Karilyn Cota, NP   No chief complaint on file.   History of Present Illness:    Dylan Mcdowell is a 72 y.o. male past medical history significant for coronary artery disease he did have PTCA done in 2001 however I am not sure exactly what vessel.  Additional problem clued essential hypertension, hyperlipidemia carotic arterial disease he had a stress test done a while ago which was abnormal after that cardiac catheterization has been done, that has been done in summer 2023.  He was find to have critical lesion in RCA that was addressed with overlapping drug-eluting stents.  He also got a lot of small branches with some disease.  He is coming today to months for follow-up he is doing great he exercised on the regular basis he works out in the garden also goes to gym couple times a week have no difficulty doing exercises.  No chest pain tightness squeezing pressure burning chest overall doing well no TIA/CVA-like symptoms  Past Medical History:  Diagnosis Date   Altered bowel habits 05/19/2016   Atherosclerotic heart disease of native coronary artery without angina pectoris    Benign prostatic hyperplasia with urinary obstruction 10/03/2014   Bursitis of left shoulder 07/01/2020   Carotid artery stenosis    Cervical myelopathy (HCC) 06/26/2020   Chronic eczematous otitis externa of right ear 01/24/2017   Formatting of this note might be different from the original. Last Assessment & Plan:  Formatting of this note might be different from the original. Concern over itching and discomfort in the ears. Chronic history of the last 6 months or so of intermittent symptoms as above.  Worse on the right side.  No recent treatment. 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 Last Assessment & Plan:  Formatting of this note might be different from the original. Concern over nasal obstruction at night. Chronic history.  No infectious sounding symptoms.  Year-round.  Really has not tried any medications.  He had septal surgery in the past. EXAM shows nasal septum slightly more to the left than the right but no   Hypothyroidism    Low back pain 08/06/2019   Formatting of this note might be different from the original. Description:   Lumbar radiculopathy 06/26/2020   Nocturia 12/15/2018   Other chronic pain 07/01/2020   Peripheral vascular disease (HCC)    Prostate cancer (HCC) 10/24/2014   Subacromial bursitis of both shoulders 07/01/2020   Ulcerative colitis, unspecified, without complications (HCC) 05/19/2016    Past Surgical History:  Procedure Laterality Date   APPENDECTOMY  1965   CORONARY ATHERECTOMY N/A 11/05/2021   Procedure: CORONARY ATHERECTOMY;  Surgeon: Orbie Pyo, MD;  Location: [facility];  Service: Cardiovascular;  Laterality: N/A;   CORONARY IMAGING/OCT N/A 11/05/2021   Procedure:  INTRAVASCULAR IMAGING/OCT;  Surgeon: Orbie Pyo, MD;  Location: [facility] INVASIVE CV LAB;  Service: Cardiovascular;  Laterality: N/A;   CORONARY STENT INTERVENTION N/A 11/05/2021   Procedure: CORONARY STENT INTERVENTION;  Surgeon: Orbie Pyo, MD;  Location: [facility];  Service: Cardiovascular;  Laterality: N/A;   CORONARY STENT PLACEMENT  2001   HEMORROIDECTOMY     HERNIA  REPAIR  1988   LEFT HEART CATH AND CORONARY ANGIOGRAPHY N/A 11/05/2021   Procedure: LEFT HEART CATH AND CORONARY ANGIOGRAPHY;  Surgeon: Orbie Pyo, MD;  Location: [facility];  Service: Cardiovascular;  Laterality: N/A;   NASAL SEPTUM SURGERY     PROSTATECTOMY     UVULECTOMY     due to snoring    Current Medications: Current Meds  Medication Sig   Ascorbic Acid 500 MG CHEW Chew 500 mg by mouth daily.   aspirin EC 81 MG tablet Take 1 tablet (81 mg total) by mouth daily. Swallow whole.   Cholecalciferol 25 MCG (1000 UT) capsule Take 1,000 Units by mouth daily.   clopidogrel (PLAVIX) 75 MG tablet TAKE 1 TABLET BY MOUTH EVERY DAY   ezetimibe (ZETIA) 10 MG tablet Take 10 mg by mouth daily.   folic acid (FOLVITE) 800 MCG tablet Take 800 mcg by mouth daily.   levothyroxine (SYNTHROID) 75 MCG tablet Take 75 mcg by mouth daily.   melatonin 5 MG TABS Take 5 mg by mouth at bedtime as needed for sleep.   Multiple Vitamin (MULTI-VITAMIN) tablet Take 2 tablets by mouth daily.   niacin 500 MG tablet Take 500 mg by mouth daily.   nitroGLYCERIN (NITROSTAT) 0.4 MG SL tablet Place 1 tablet (0.4 mg total) under the tongue every 5 (five) minutes as needed. (Patient taking differently: Place 0.4 mg under the tongue every 5 (five) minutes as needed for chest pain.)   Omega-3 Fatty Acids (FISH OIL) 1000 MG CAPS Take 1 capsule by mouth daily.   ranolazine (RANEXA) 500 MG 12 hr tablet Take 1 tablet (500 mg total) by mouth 2 (two) times daily.   rosuvastatin (CRESTOR) 40 MG tablet Take 1 tablet (40 mg total) by mouth daily.   tamsulosin (FLOMAX) 0.4 MG CAPS capsule Take 0.4 mg by mouth daily.     Allergies:   Patient has no known allergies.   Social History   Socioeconomic History   Marital status: Married    Spouse name: Not on file   Number of children: Not on file   Years of education: Not on file   Highest education level: Not on file  Occupational History   Not on file  Tobacco Use    Smoking status: Never    Passive exposure: Past   Smokeless tobacco: Never  Vaping Use   Vaping status: Never Used  Substance and Sexual Activity   Alcohol use: Yes    Comment: once a month   Drug use: Never   Sexual activity: Not on file  Other Topics Concern   Not on file  Social History Narrative   Not on file   Social Drivers of Health   Financial Resource Strain: Not on file  Food Insecurity: Not on file  Transportation Needs: Not on file  Physical Activity: Not on file  Stress: Not on file  Social Connections: Unknown (08/04/2021)   [facility]   Social Network    Social Network: Not on file     Family History: The patient's family history  includes Alzheimer's disease in his mother; Heart attack in his brother, brother, and father. ROS:   Please see the history of present illness.    All 14 point review of systems negative except as described per history of present illness  EKGs/Labs/Other Studies Reviewed:         Recent Labs: 05/13/2022: ALT 26  Recent Lipid Panel    Component Value Date/Time   CHOL 127 05/13/2022 0837   TRIG 66 05/13/2022 0837   HDL 55 05/13/2022 0837   CHOLHDL 2.3 05/13/2022 0837   LDLCALC 58 05/13/2022 0837   LDLDIRECT 81 02/03/2022 1002    Physical Exam:    VS:  BP 116/68   Pulse 80   Ht 6' (1.829 m)   Wt 176 lb (79.8 kg)   SpO2 97%   BMI 23.87 kg/m     Wt Readings from Last 3 Encounters:  03/21/23 176 lb (79.8 kg)  09/02/22 180 lb 9.6 oz (81.9 kg)  07/30/22 183 lb 12.8 oz (83.4 kg)     GEN:  Well nourished, well developed in no acute distress HEENT: Normal NECK: No JVD; No carotid bruits LYMPHATICS: No lymphadenopathy CARDIAC: RRR, no murmurs, no rubs, no gallops RESPIRATORY:  Clear to auscultation without rales, wheezing or rhonchi  ABDOMEN: Soft, non-tender, non-distended MUSCULOSKELETAL:  No edema; No deformity  SKIN: Warm and dry LOWER EXTREMITIES: no swelling NEUROLOGIC:   Alert and oriented x 3 PSYCHIATRIC:  Normal affect   ASSESSMENT:    1. Coronary artery disease involving native coronary artery of native heart with other form of angina pectoris (HCC)   2. Hypercholesterolemia   3. Peripheral vascular disease (HCC)    PLAN:    In order of problems listed above:  Coronary artery disease stable from that point review I still suggest to continue dual antiplatelet therapy.  He has no complication of it there is no I want to do it is quite advanced disease noted on coronary arteries.  Likely he is asymptomatic he eats all right and he exercised on the regular basis. Hypercholesterolemia I did review K PN his cholesterol by primary care physician total cholesterol 136 HDL 52 and LDL of 58 this is from February 24, 2023.  He is taking Crestor 40 which is high intense statin. Peripheral vascular disease, carotic arterial disease but no significant obstruction based on carotic ultrasound from 2021.   Medication Adjustments/Labs and Tests Ordered: Current medicines are reviewed at length with the patient today.  Concerns regarding medicines are outlined above.  No orders of the defined types were placed in this encounter.  Medication changes: No orders of the defined types were placed in this encounter.   Signed, Georgeanna Lea, MD, [facility] 03/21/2023 10:03 AM    [facility]

## 2023-03-21 NOTE — Patient Instructions (Signed)

## 2023-05-17 ENCOUNTER — Other Ambulatory Visit: Payer: Self-pay

## 2023-08-01 ENCOUNTER — Other Ambulatory Visit: Payer: Self-pay | Admitting: Cardiology

## 2023-08-22 ENCOUNTER — Telehealth: Payer: Self-pay | Admitting: Cardiology

## 2023-08-22 DIAGNOSIS — E7800 Pure hypercholesterolemia, unspecified: Secondary | ICD-10-CM | POA: Diagnosis not present

## 2023-08-22 DIAGNOSIS — E039 Hypothyroidism, unspecified: Secondary | ICD-10-CM | POA: Diagnosis not present

## 2023-08-22 DIAGNOSIS — I2510 Atherosclerotic heart disease of native coronary artery without angina pectoris: Secondary | ICD-10-CM | POA: Diagnosis not present

## 2023-08-22 DIAGNOSIS — I499 Cardiac arrhythmia, unspecified: Secondary | ICD-10-CM | POA: Diagnosis not present

## 2023-08-22 DIAGNOSIS — Z6824 Body mass index (BMI) 24.0-24.9, adult: Secondary | ICD-10-CM | POA: Diagnosis not present

## 2023-08-22 NOTE — Telephone Encounter (Signed)
 Patient coming in to schedule follow up with Dr. Gordan Latina- now scheduled in August- patient states that when he was put on Ranolazine  he thought he was supposed to take one tablet once a day but upon refilling his medicine last week he read on the bottle he is supposed to be taking one tablet twice a day-   He is unsure what this medicine is for but would like to know if he needs to take it twice a day starting now or if the once a day will suffice.  Please call patient at [phone]

## 2023-08-22 NOTE — Telephone Encounter (Signed)
 Spoke with pt who states that he has never had chest pain and exercises daily. Please advise if Ranolazine  needs to be continued as the pt has only been taking one daily.

## 2023-08-22 NOTE — Telephone Encounter (Signed)
 Left vm to return call.

## 2023-08-23 LAB — LAB REPORT - SCANNED
EGFR: 73
Free T4: 1.1 ng/dL
TSH: 2.13

## 2023-08-24 NOTE — Telephone Encounter (Signed)
Recommendations reviewed with pt as per Dr. Krasowski's note.  Pt verbalized understanding and had no additional questions.  

## 2023-11-03 ENCOUNTER — Ambulatory Visit: Attending: Cardiology | Admitting: Cardiology

## 2023-11-03 ENCOUNTER — Encounter: Payer: Self-pay | Admitting: Cardiology

## 2023-11-03 VITALS — BP 108/60 | HR 77 | Ht 72.0 in | Wt 174.4 lb

## 2023-11-03 DIAGNOSIS — I739 Peripheral vascular disease, unspecified: Secondary | ICD-10-CM | POA: Diagnosis not present

## 2023-11-03 DIAGNOSIS — I25118 Atherosclerotic heart disease of native coronary artery with other forms of angina pectoris: Secondary | ICD-10-CM

## 2023-11-03 DIAGNOSIS — E7800 Pure hypercholesterolemia, unspecified: Secondary | ICD-10-CM | POA: Diagnosis not present

## 2023-11-03 NOTE — Patient Instructions (Signed)

## 2023-11-03 NOTE — Progress Notes (Unsigned)
 Cardiology Office Note:    Date:  11/03/2023   ID:  Dylan Mcdowell, DOB 1950-05-19, MRN [id number]  PCP:  Glenford Harlene SAILOR, NP  Cardiologist:  Lamar Fitch, MD    Referring MD: Glenford Harlene SAILOR, NP   No chief complaint on file.   History of Present Illness:    Dylan Mcdowell is a 73 y.o. male past medical history significant for coronary artery disease in 2001 he did have PTCA and stenting however I am not sure exactly what artery, then in 2023 he had another cardiac catheterization showing critical lesion in RCA that being addressed with overlapping drug-eluting stent.  Additional problem includes dyslipidemia, essential hypertension, peripheral vascular disease, last study of coronary arteries did not show any significant stenosis. Comes today 2 months for follow-up overall cardiac wise doing well.  Denies have any chest pain tightness squeezing pressure burning chest.  Does what he wants to do goes to gym 5 times a week and exercise aggressively with no difficulties.  Past Medical History:  Diagnosis Date   Abnormal stress test 11/03/2021   Altered bowel habits 05/19/2016   Atherosclerotic heart disease of native coronary artery without angina pectoris    Benign prostatic hyperplasia with urinary obstruction 10/03/2014   Bursitis of left shoulder 07/01/2020   Carotid artery stenosis    Cervical myelopathy (HCC) 06/26/2020   Chronic eczematous otitis externa of right ear 01/24/2017   Formatting of this note might be different from the original. Last Assessment & Plan:  Formatting of this note might be different from the original. Concern over itching and discomfort in the ears. Chronic history of the last 6 months or so of intermittent symptoms as above.  Worse on the right side.  No recent treatment. 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 Last Assessment & Plan:  Formatting of this note might be different from the original. Concern over nasal obstruction at night. Chronic history.  No infectious sounding symptoms.  Year-round.  Really has not tried any medications.  He had septal surgery in the past. EXAM shows nasal septum slightly more to the left than the right but no   Hypothyroidism    Low back pain 08/06/2019   Formatting of this note might be different from the original. Description:   Lumbar radiculopathy 06/26/2020   Nocturia 12/15/2018   Other chronic pain 07/01/2020   Peripheral vascular disease (HCC)    Prostate cancer (HCC) 10/24/2014   Subacromial bursitis of both shoulders 07/01/2020   Ulcerative colitis, unspecified, without complications (HCC) 05/19/2016    Past Surgical History:  Procedure Laterality Date   APPENDECTOMY  1965   CORONARY ATHERECTOMY N/A 11/05/2021   Procedure: CORONARY ATHERECTOMY;  Surgeon: Wendel Lurena POUR, MD;  Location: [facility];  Service: Cardiovascular;  Laterality: N/A;   CORONARY IMAGING/OCT N/A 11/05/2021   Procedure: INTRAVASCULAR IMAGING/OCT;  Surgeon: Wendel Lurena POUR, MD;  Location: [facility];  Service: Cardiovascular;  Laterality: N/A;   CORONARY STENT INTERVENTION N/A 11/05/2021  Procedure: CORONARY STENT INTERVENTION;  Surgeon: Thukkani, Arun K, MD;  Location: [facility];  Service: Cardiovascular;  Laterality: N/A;   CORONARY STENT PLACEMENT  2001   HEMORROIDECTOMY     HERNIA REPAIR  1988   LEFT HEART CATH AND CORONARY ANGIOGRAPHY N/A 11/05/2021   Procedure: LEFT  HEART CATH AND CORONARY ANGIOGRAPHY;  Surgeon: Wendel Lurena POUR, MD;  Location: [facility];  Service: Cardiovascular;  Laterality: N/A;   NASAL SEPTUM SURGERY     PROSTATECTOMY     UVULECTOMY     due to snoring    Current Medications: Current Meds  Medication Sig   Ascorbic Acid 500 MG CHEW Chew 500 mg by mouth daily.   aspirin  EC 81 MG tablet Take 1 tablet (81 mg total) by mouth daily. Swallow whole.   Cholecalciferol 25 MCG (1000 UT) capsule Take 1,000 Units by mouth daily.   clopidogrel  (PLAVIX ) 75 MG tablet TAKE 1 TABLET BY MOUTH EVERY DAY   ezetimibe (ZETIA) 10 MG tablet Take 10 mg by mouth daily.   folic acid (FOLVITE) 800 MCG tablet Take 800 mcg by mouth daily.   levothyroxine (SYNTHROID) 75 MCG tablet Take 75 mcg by mouth daily.   melatonin 5 MG TABS Take 5 mg by mouth at bedtime as needed for sleep.   Multiple Vitamin (MULTI-VITAMIN) tablet Take 2 tablets by mouth daily.   niacin 500 MG tablet Take 500 mg by mouth daily.   nitroGLYCERIN  (NITROSTAT ) 0.4 MG SL tablet Place 1 tablet (0.4 mg total) under the tongue every 5 (five) minutes as needed. (Patient taking differently: Place 0.4 mg under the tongue every 5 (five) minutes as needed for chest pain.)   Omega-3 Fatty Acids (FISH OIL) 1000 MG CAPS Take 1 capsule by mouth daily.   rosuvastatin  (CRESTOR ) 40 MG tablet Take 1 tablet (40 mg total) by mouth daily.   tamsulosin (FLOMAX) 0.4 MG CAPS capsule Take 0.4 mg by mouth daily.     Allergies:   Patient has no known allergies.   Social History   Socioeconomic History   Marital status: Married    Spouse name: Not on file   Number of children: Not on file   Years of education: Not on file   Highest education level: Not on file  Occupational History   Not on file  Tobacco Use   Smoking status: Never    Passive exposure: Past   Smokeless tobacco: Never  Vaping Use   Vaping status: Never Used  Substance and Sexual Activity   Alcohol  use: Yes    Comment: once a  month   Drug use: Never   Sexual activity: Not on file  Other Topics Concern   Not on file  Social History Narrative   Not on file   Social Drivers of Health   Financial Resource Strain: Not on file  Food Insecurity: Not on file  Transportation Needs: Not on file  Physical Activity: Not on file  Stress: Not on file  Social Connections: Unknown (08/04/2021)   [facility]   Social Network    Social Network: Not on file     Family History: The patient's family history includes Alzheimer's disease in his mother; Heart attack in his brother, brother, and father. ROS:   Please see the history of present illness.    All 14 point review of systems negative except as described per history of present illness  EKGs/Labs/Other Studies Reviewed:    EKG Interpretation Date/Time:  Thursday November 03 2023 08:15:09 EDT Ventricular Rate:  77 PR Interval:  136 QRS Duration:  82 QT Interval:  398 QTC Calculation: 450 R Axis:   54  Text Interpretation: Normal sinus rhythm Normal ECG When compared with ECG of 03-Nov-2021 15:33, No significant change was found Confirmed by Bernie Charleston [phone]) on 11/03/2023 8:24:45 AM    Recent Labs: No results found for requested labs within last 365 days.  Recent Lipid Panel    Component Value Date/Time   CHOL 127 05/13/2022 0837   TRIG 66 05/13/2022 0837   HDL 55 05/13/2022 0837   CHOLHDL 2.3 05/13/2022 0837   LDLCALC 58 05/13/2022 0837   LDLDIRECT 81 02/03/2022 1002    Physical Exam:    VS:  BP 108/60   Pulse 77   Ht 6' (1.829 m)   Wt 174 lb 6.4 oz (79.1 kg)   SpO2 95%   BMI 23.65 kg/m     Wt Readings from Last 3 Encounters:  11/03/23 174 lb 6.4 oz (79.1 kg)  03/21/23 176 lb (79.8 kg)  09/02/22 180 lb 9.6 oz (81.9 kg)     GEN:  Well nourished, well developed in no acute distress HEENT: Normal NECK: No JVD; No carotid bruits LYMPHATICS: No lymphadenopathy CARDIAC: RRR, no murmurs, no rubs, no  gallops RESPIRATORY:  Clear to auscultation without rales, wheezing or rhonchi  ABDOMEN: Soft, non-tender, non-distended MUSCULOSKELETAL:  No edema; No deformity  SKIN: Warm and dry LOWER EXTREMITIES: no swelling NEUROLOGIC:  Alert and oriented x 3 PSYCHIATRIC:  Normal affect   ASSESSMENT:    1. Coronary artery disease involving native coronary artery of native heart with other form of angina pectoris (HCC)   2. Hypercholesterolemia   3. Peripheral vascular disease (HCC)    PLAN:    In order of problems listed above:  Coronary disease: Stable asymptomatic aggressive exercises doing well. Hypercholesterolemia I did review his K PN which show me his total cholesterol of 136 and HDL 52 this is from December of last year continue present management. Peripheral vascular disease last carotic ultrasound reviewed showing no significant stenosis. We did talk about healthy lifestyle need to exercise on a regular basis which she already does.  He brought an issue of potential dementia.  He said that he is forgetting a lot of stuff.  I suggested to talk his to his primary care physician he may require neuro psychological testing.  He is worried because he is moderate of having dementia which was 73 years old she started having suppressed first sign of this and then 6 years later she was unable to feed herself   Medication Adjustments/Labs and Tests Ordered: Current medicines are reviewed at length with the patient today.  Concerns regarding medicines are outlined above.  Orders Placed This Encounter  Procedures   EKG 12-Lead   Medication changes: No orders of the defined types were placed in this encounter.   Signed, [place] DOROTHA Bernie, MD, [facility] 11/03/2023 8:40 AM    [facility]

## 2023-12-08 DIAGNOSIS — Z9181 History of falling: Secondary | ICD-10-CM | POA: Diagnosis not present

## 2023-12-08 DIAGNOSIS — M79672 Pain in left foot: Secondary | ICD-10-CM | POA: Diagnosis not present

## 2023-12-08 DIAGNOSIS — E039 Hypothyroidism, unspecified: Secondary | ICD-10-CM | POA: Diagnosis not present

## 2023-12-08 DIAGNOSIS — E7800 Pure hypercholesterolemia, unspecified: Secondary | ICD-10-CM | POA: Diagnosis not present

## 2023-12-08 DIAGNOSIS — Z8546 Personal history of malignant neoplasm of prostate: Secondary | ICD-10-CM | POA: Diagnosis not present

## 2023-12-08 DIAGNOSIS — I2510 Atherosclerotic heart disease of native coronary artery without angina pectoris: Secondary | ICD-10-CM | POA: Diagnosis not present

## 2023-12-08 DIAGNOSIS — G3184 Mild cognitive impairment, so stated: Secondary | ICD-10-CM | POA: Diagnosis not present

## 2023-12-09 LAB — LAB REPORT - SCANNED: EGFR: 94

## 2023-12-14 DIAGNOSIS — G3184 Mild cognitive impairment, so stated: Secondary | ICD-10-CM | POA: Diagnosis not present

## 2023-12-14 DIAGNOSIS — R519 Headache, unspecified: Secondary | ICD-10-CM | POA: Diagnosis not present

## 2023-12-15 ENCOUNTER — Encounter: Payer: Self-pay | Admitting: Family

## 2024-01-18 DIAGNOSIS — M79601 Pain in right arm: Secondary | ICD-10-CM | POA: Diagnosis not present

## 2024-01-18 DIAGNOSIS — M79609 Pain in unspecified limb: Secondary | ICD-10-CM | POA: Diagnosis not present

## 2024-01-18 DIAGNOSIS — S62304A Unspecified fracture of fourth metacarpal bone, right hand, initial encounter for closed fracture: Secondary | ICD-10-CM | POA: Diagnosis not present

## 2024-01-19 DIAGNOSIS — S62201A Unspecified fracture of first metacarpal bone, right hand, initial encounter for closed fracture: Secondary | ICD-10-CM | POA: Diagnosis not present

## 2024-01-23 DIAGNOSIS — Z0000 Encounter for general adult medical examination without abnormal findings: Secondary | ICD-10-CM | POA: Diagnosis not present

## 2024-01-23 DIAGNOSIS — Z1389 Encounter for screening for other disorder: Secondary | ICD-10-CM | POA: Diagnosis not present

## 2024-01-23 DIAGNOSIS — Z9181 History of falling: Secondary | ICD-10-CM | POA: Diagnosis not present

## 2024-01-23 DIAGNOSIS — Z6823 Body mass index (BMI) 23.0-23.9, adult: Secondary | ICD-10-CM | POA: Diagnosis not present

## 2024-01-23 DIAGNOSIS — H5330 Unspecified disorder of binocular vision: Secondary | ICD-10-CM | POA: Diagnosis not present

## 2024-02-13 DIAGNOSIS — S62231A Other displaced fracture of base of first metacarpal bone, right hand, initial encounter for closed fracture: Secondary | ICD-10-CM | POA: Diagnosis not present

## 2024-02-20 DIAGNOSIS — H43811 Vitreous degeneration, right eye: Secondary | ICD-10-CM | POA: Diagnosis not present
